# Patient Record
Sex: Female | Born: 1980 | Race: White | Hispanic: No | Marital: Married | Smoking: Never smoker
Health system: Southern US, Community
[De-identification: ages and names within clinical notes are randomized; demographics above are authoritative.]

## PROBLEM LIST (undated history)

## (undated) DIAGNOSIS — R87619 Unspecified abnormal cytological findings in specimens from cervix uteri: Secondary | ICD-10-CM

## (undated) DIAGNOSIS — E785 Hyperlipidemia, unspecified: Secondary | ICD-10-CM

## (undated) DIAGNOSIS — N946 Dysmenorrhea, unspecified: Secondary | ICD-10-CM

## (undated) DIAGNOSIS — Z90710 Acquired absence of both cervix and uterus: Secondary | ICD-10-CM

## (undated) DIAGNOSIS — Z8742 Personal history of other diseases of the female genital tract: Secondary | ICD-10-CM

## (undated) DIAGNOSIS — N879 Dysplasia of cervix uteri, unspecified: Secondary | ICD-10-CM

## (undated) DIAGNOSIS — O99345 Other mental disorders complicating the puerperium: Secondary | ICD-10-CM

## (undated) DIAGNOSIS — K219 Gastro-esophageal reflux disease without esophagitis: Secondary | ICD-10-CM

## (undated) DIAGNOSIS — E010 Iodine-deficiency related diffuse (endemic) goiter: Secondary | ICD-10-CM

## (undated) DIAGNOSIS — F53 Postpartum depression: Secondary | ICD-10-CM

## (undated) DIAGNOSIS — D069 Carcinoma in situ of cervix, unspecified: Secondary | ICD-10-CM

## (undated) HISTORY — DX: Iodine-deficiency related diffuse (endemic) goiter: E01.0

## (undated) HISTORY — DX: Dysmenorrhea, unspecified: N94.6

## (undated) HISTORY — DX: Acquired absence of both cervix and uterus: Z90.710

## (undated) HISTORY — DX: Personal history of other diseases of the female genital tract: Z87.42

## (undated) HISTORY — DX: Postpartum depression: F53.0

## (undated) HISTORY — DX: Hyperlipidemia, unspecified: E78.5

## (undated) HISTORY — DX: Unspecified abnormal cytological findings in specimens from cervix uteri: R87.619

## (undated) HISTORY — DX: Gastro-esophageal reflux disease without esophagitis: K21.9

## (undated) HISTORY — DX: Other mental disorders complicating the puerperium: O99.345

## (undated) HISTORY — DX: Carcinoma in situ of cervix, unspecified: D06.9

## (undated) HISTORY — DX: Dysplasia of cervix uteri, unspecified: N87.9

---

## 2002-07-26 DIAGNOSIS — R87619 Unspecified abnormal cytological findings in specimens from cervix uteri: Secondary | ICD-10-CM

## 2002-07-26 HISTORY — DX: Unspecified abnormal cytological findings in specimens from cervix uteri: R87.619

## 2002-09-26 DIAGNOSIS — N879 Dysplasia of cervix uteri, unspecified: Secondary | ICD-10-CM

## 2002-09-26 HISTORY — DX: Dysplasia of cervix uteri, unspecified: N87.9

## 2002-09-26 HISTORY — PX: COLPOSCOPY: SHX161

## 2004-06-27 ENCOUNTER — Inpatient Hospital Stay: Payer: Self-pay

## 2007-05-18 ENCOUNTER — Inpatient Hospital Stay: Payer: Self-pay

## 2009-12-09 ENCOUNTER — Emergency Department: Payer: Self-pay | Admitting: Emergency Medicine

## 2009-12-13 HISTORY — PX: CHOLECYSTECTOMY, LAPAROSCOPIC: SHX56

## 2014-03-15 DIAGNOSIS — E010 Iodine-deficiency related diffuse (endemic) goiter: Secondary | ICD-10-CM

## 2014-03-15 HISTORY — DX: Iodine-deficiency related diffuse (endemic) goiter: E01.0

## 2014-05-01 ENCOUNTER — Ambulatory Visit: Payer: Self-pay | Admitting: Obstetrics and Gynecology

## 2014-05-10 ENCOUNTER — Ambulatory Visit: Payer: Self-pay | Admitting: Obstetrics and Gynecology

## 2014-05-14 HISTORY — PX: SALPINGECTOMY: SHX328

## 2014-06-05 ENCOUNTER — Ambulatory Visit
Admit: 2014-06-05 | Disposition: A | Discharge status: Home / Self Care | Payer: Self-pay | Attending: Obstetrics and Gynecology | Admitting: Obstetrics and Gynecology

## 2014-06-12 ENCOUNTER — Ambulatory Visit
Admit: 2014-06-12 | Disposition: A | Discharge status: Home / Self Care | Payer: Self-pay | Attending: Obstetrics and Gynecology | Admitting: Obstetrics and Gynecology

## 2014-06-12 LAB — CBC
HCT: 45.4 % (ref 35.0–47.0)
HGB: 15.3 g/dL (ref 12.0–16.0)
MCH: 29.6 pg (ref 26.0–34.0)
MCHC: 33.7 g/dL (ref 32.0–36.0)
MCV: 88 fL (ref 80–100)
PLATELETS: 201 10*3/uL (ref 150–440)
RBC: 5.16 10*6/uL (ref 3.80–5.20)
RDW: 12.9 % (ref 11.5–14.5)
WBC: 6.8 10*3/uL (ref 3.6–11.0)

## 2014-06-13 DIAGNOSIS — Z90710 Acquired absence of both cervix and uterus: Secondary | ICD-10-CM

## 2014-06-13 HISTORY — DX: Acquired absence of both cervix and uterus: Z90.710

## 2014-06-14 ENCOUNTER — Ambulatory Visit
Admit: 2014-06-14 | Disposition: A | Discharge status: Home / Self Care | Payer: Self-pay | Attending: Obstetrics and Gynecology | Admitting: Obstetrics and Gynecology

## 2014-06-18 ENCOUNTER — Ambulatory Visit
Admit: 2014-06-18 | Disposition: A | Discharge status: Home / Self Care | Payer: Self-pay | Attending: Obstetrics and Gynecology | Admitting: Obstetrics and Gynecology

## 2014-07-08 LAB — SURGICAL PATHOLOGY

## 2014-07-14 NOTE — Op Note (Signed)
PATIENT NAME:  Candace Vaughn, Candace Vaughn MR#:  [id number] DATE OF BIRTH:  Mar 02, 1981  DATE OF PROCEDURE:  06/18/2014   ATTENDING DICTATING:  [place] Bingharlie Harleyquinn Gasser, MD   PREOPERATIVE DIAGNOSES:   1.  Adenocarcinoma in situ of the cervix.  2.  Satisfied fertility.   POSTOPERATIVE DIAGNOSES: 1.  Adenocarcinoma in situ of the cervix.  2.  Satisfied fertility.   PROCEDURE: Total laparoscopic hysterectomy, bilateral salpingectomy and cystoscopy.   SURGEON: [facility] Bingharlie Onyx Edgley. MD   ASSISTANT: Vena AustriaAndreas Staebler, MD   ANESTHESIA:  General and local.  ESTIMATED BLOOD LOSS: 100 mL.   INTRAVENOUS FLUIDS: 1100 mL crystalloid.   URINE OUTPUT: 300 mL via indwelling Foley catheter.   ANTIBIOTICS: 2 grams Ancef given preoperatively.   SPECIMENS: Cervix, uterus and bilateral tubes to pathology.   COMPLICATIONS: None.   FINDINGS: Exam under anesthesia revealed anteverted mobile uterus with no nodularity or pelvic sidewall fixations.  Also, no adnexal masses were palpable.  On diagnostic laparoscopy, a normal liver edge and stomach edge.  There was 1 filmy adhesion of the omentum to the posterior aspect of the anterior abdominal wall, which was easily lysed and there were no other abdominal or pelvic adhesions with normal-appearing fallopian tubes, ovaries, uterus and cervix. On cystoscopy, normal appearing bladder with bilateral efflux from the ureteral orifices.   DESCRIPTION OF PROCEDURE: The patient was taken to the Operating Room where the anesthesia was administered. She was then prepped and draped in normal sterile fashion in dorsal lithotomy position in [place] stirrups. Next, a Foley catheter was then placed and a speculum was inserted in the patient's vagina and a V-care uterine manipulator device was then inserted without difficulty. Next, gloves were changed and attention was then turned to the patient's umbilicus where it was infiltrated with local anesthesia and then turned to the umbilicus where it was  infiltrated with local anesthesia and using the open technique a 12 mm balloon trocar was then placed and the laparoscope was then introduced with the above-noted findings. Next, a right 5 mm and a left 10 mm port were then placed under direct visualization after injection of local anesthesia.  Next, the fallopian tubes were then separated from the ovaries after identification of the bilateral ureters into the pelvis, with the LigaSure device. Next, the bilateral round ligaments were then cauterized and transected with the LigaSure device and the bladder flap was created after separation of the anterior and posterior aspects of the broad ligament. Next, the bilateral uterine arteries were then serially cauterized and transected with the LigaSure device and a colpotomy was made with the monopolar scissors on cut and then coag current  and then the specimen was then removed from the patient's vagina and a bulb was then placed to maintain insufflation.  The vaginal cuff was then closed with a running V-lock 2-0 suture using the Endo Stitch device. There was some slight oozing near the uterosacral ligaments, fibrillar was then placed into these crevices and the pressure dropped down to 6 mmHg and the right and left aspects were then inspected for excellent hemostasis. Next, cystoscopy was then performed with the above-noted findings and attention was then turned to the patient's abdomen with showed excellent hemostasis. The right and left lower quadrant trocars were then removed under direct visualization and then the balloon trocar at the umbilicus was removed. The fascia here and at the left lower quadrant, 10 mm port, were then closed using a running suture of 2-0 Vicryl and the skin incisions there were closed  with 4-0 Monocryl with the skin incision closed a the 5 mm port with a Dermabond which was also placed at the other ports.   Sponge, lap, needle, and instrument counts were correct x 2, and the patient was  taken to recovery room, awake, alert, breathing independently in stable condition.     ____________________________ [place] Bing, MD cp:DT D: 06/18/2014 13:56:14 ET T: 06/18/2014 14:20:17 ET JOB#: [id number]  cc: [place] Bing, MD, <Dictator> [place] Bing MD ELECTRONICALLY SIGNED 06/27/2014 10:23

## 2014-07-14 NOTE — Op Note (Signed)
PATIENT NAME:  [name], Candace E MR#:  [id number] DATE OF BIRTH:  12/20/1980  DATE OF PROCEDURE:  05/10/2014  PREOPERATIVE DIAGNOSES:  1. AGUS Pap smear.  2. History of CIN-3.   POSTOPERATIVE DIAGNOSES:  1. AGUS Pap smear. 2. History of CIN-3.   PROCEDURES PERFORMED:  1. Cold knife conization. 2. Endocervical curettage.  3. Dilation and curettage.   SURGEON: [place] Bingharlie Katye Valek, M.D.   ASSISTANT: None.   ANESTHESIA:  General and paracervical block.  ANTIBIOTICS: None indicated.   ESTIMATED BLOOD LOSS: 25 mL.   URINE OUTPUT: No in and out catheterization was done of the bladder.   Venous thromboembolism prophylaxis not indicated.   COMPLICATIONS: None.   SPECIMENS: Cold knife conization with stitch at 12:00, endocervical curettage and endometrial curettings.   FINDINGS: Exam under anesthesia revealed normal anteverted uterus and no adnexal masses or uterine masses. On speculum exam and staining with Lugol's to the cervix, abnormally appearing adequate transformation zone and dysplasia noted. Uterus sounded to 8.5 cm with a minimal amount of endometrial curettings noted.   DESCRIPTION OF PROCEDURE: The patient was taken to the operating room where anesthesia was administered. She was prepped and draped in normal sterile fashion in dorsal lithotomy position in [place] stirrups. Next, a speculum was then inserted and Lugol's was stained on the cervix with the above-noted findings. Next, a paracervical block was done, as well as, parastomal block with 1% lidocaine with epinephrine. Next, two 1-0 chromics were used on the lateral portions of the cervix and tied down to help with hemostasis. Next, a #1 silk was then tied at the 12 o'clock position and another  #1 silk was placed at 6:00 and cold knife conization was done with the 11 blade and handed off to pathology with the stitch at 6:00 removed.   Next, a single-tooth tenaculum was applied to the cervix and an endocervical curettage was  done and handed off, and the uterus was sounded to 8.5 cm, and endometrial curetting was done in all quadrants of the uterus and handed off to pathology.   Hemostasis was achieved with cautery and all instruments were removed from the patient's vagina.  Sponge, lap, needle, and instrument counts were correct x2. The patient was transferred to the recovery room awake, alert, breathing independently in stable condition.    ____________________________ [place] Bingharlie Dayson Aboud, MD cp:kl D: 05/10/2014 11:37:01 ET T: 05/10/2014 23:01:29 ET JOB#: [id number]  cc: [place] Bingharlie Ad Guttman, MD, <Dictator> [place] BingHARLIE Amran Malter MD ELECTRONICALLY SIGNED 05/30/2014 17:26

## 2014-12-02 DIAGNOSIS — Z8742 Personal history of other diseases of the female genital tract: Secondary | ICD-10-CM

## 2014-12-02 HISTORY — DX: Personal history of other diseases of the female genital tract: Z87.42

## 2014-12-05 ENCOUNTER — Other Ambulatory Visit: Payer: Self-pay | Admitting: Obstetrics and Gynecology

## 2014-12-05 DIAGNOSIS — E010 Iodine-deficiency related diffuse (endemic) goiter: Secondary | ICD-10-CM

## 2014-12-12 ENCOUNTER — Ambulatory Visit
Admission: RE | Admit: 2014-12-12 | Discharge: 2014-12-12 | Disposition: A | Discharge status: Home / Self Care | Payer: Managed Care, Other (non HMO) | Source: Ambulatory Visit | Attending: Obstetrics and Gynecology | Admitting: Obstetrics and Gynecology

## 2014-12-12 DIAGNOSIS — E049 Nontoxic goiter, unspecified: Secondary | ICD-10-CM | POA: Diagnosis present

## 2014-12-12 DIAGNOSIS — E010 Iodine-deficiency related diffuse (endemic) goiter: Secondary | ICD-10-CM

## 2016-10-05 ENCOUNTER — Other Ambulatory Visit: Payer: Self-pay | Admitting: Obstetrics and Gynecology

## 2016-12-23 ENCOUNTER — Other Ambulatory Visit: Payer: Self-pay | Admitting: Obstetrics and Gynecology

## 2016-12-27 ENCOUNTER — Other Ambulatory Visit: Payer: Self-pay | Admitting: Obstetrics and Gynecology

## 2016-12-29 ENCOUNTER — Other Ambulatory Visit: Payer: Self-pay | Admitting: Obstetrics and Gynecology

## 2017-03-05 ENCOUNTER — Other Ambulatory Visit: Payer: Self-pay | Admitting: Obstetrics and Gynecology

## 2017-11-03 ENCOUNTER — Ambulatory Visit (INDEPENDENT_AMBULATORY_CARE_PROVIDER_SITE_OTHER): Payer: Self-pay | Admitting: Obstetrics and Gynecology

## 2017-11-03 ENCOUNTER — Encounter: Payer: Self-pay | Admitting: Obstetrics and Gynecology

## 2017-11-03 VITALS — BP 120/90 | HR 84 | Ht 64.0 in | Wt 205.0 lb

## 2017-11-03 DIAGNOSIS — M79671 Pain in right foot: Secondary | ICD-10-CM

## 2017-11-03 DIAGNOSIS — Z01419 Encounter for gynecological examination (general) (routine) without abnormal findings: Secondary | ICD-10-CM

## 2017-11-03 DIAGNOSIS — Z13220 Encounter for screening for lipoid disorders: Secondary | ICD-10-CM

## 2017-11-03 DIAGNOSIS — F419 Anxiety disorder, unspecified: Secondary | ICD-10-CM

## 2017-11-03 DIAGNOSIS — M25862 Other specified joint disorders, left knee: Secondary | ICD-10-CM

## 2017-11-03 DIAGNOSIS — M25842 Other specified joint disorders, left hand: Secondary | ICD-10-CM

## 2017-11-03 DIAGNOSIS — Z0000 Encounter for general adult medical examination without abnormal findings: Secondary | ICD-10-CM

## 2017-11-03 DIAGNOSIS — Z124 Encounter for screening for malignant neoplasm of cervix: Secondary | ICD-10-CM

## 2017-11-03 MED ORDER — BUSPIRONE HCL 7.5 MG PO TABS: ORAL_TABLET | ORAL | 5 refills | Status: DC

## 2017-11-03 NOTE — Progress Notes (Signed)
PCP:  Rica Records, PA-C   Chief Complaint  Patient presents with  . Gynecologic Exam     HPI:      [name] is a 37 y.o. No obstetric history on file. who LMP was No LMP recorded. Patient has had a hysterectomy., presents today for her annual examination.  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  She does not have intermenstrual bleeding.  Sex activity: single partner, contraception - post menopausal status.  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 There is no FH of ovarian cancer. The patient does do self-breast exams.  Tobacco use: The patient denies current or previous tobacco use. Alcohol use: none No drug use.  Exercise: moderately active  She does get adequate calcium but not Vitamin D in her diet.  She has a hx of hyperlipidemia, on lipitor 20 mg in the past. Stopped about a yr ago when Rx ran out. Due for labs. No side effects with Rx.  Pt also with hx of anxiety. Can't turn mind off at night. Had been taking buspar 7.5 mg QHS with improved ability to sleep. Rx ran out. Pt would like RF. Has excessive worry, feeling nervous at times. Doesn't feel she needs buspar BID. Had depression sx with zoloft in past.  FH of anxiety and pt with grief/loss in past.   Pt has also noted 3 new bumps in the past yr. She has a hard place on her RT heel that developed about a yr ago and now notices some swelling on dorsal aspect of foot. Can be painful at times. Pt then developed hard spot on patella/superior tibial aspect of LT knee about 3 months ago. Hurts if she puts pressure on it with kneeling. Finally, pt noted a knot in her LT palmar aspect around 5th MCP joint about 6 months ago. Not painful.   Past Medical History:  Diagnosis Date  . Abnormal Pap smear of cervix 09/26/2002   ASC-US  . Abnormal Pap smear of cervix 07/26/2002   LGSIL; @[facility]  SUSPICIOUS HGSIL  . Adenocarcinoma in situ (AIS) of uterine cervix   . 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 Dysmenorrhea    mild  . Esophageal reflux   . H/O: hysterectomy 06/13/2014   CP JR; TLH/BS  . History of abnormal cervical Pap smear 12/02/2014   ASCUS/neg-needs yearly paps for several yrs due to AIS  . Hyperlipidemia    started on lipitor 3/14;recheck in 5/14  . Postpartum depression   . Thyromegaly 2016   no nodules, normal thryroid labs    Past Surgical History:  Procedure Laterality Date  . CHOLECYSTECTOMY, LAPAROSCOPIC  12/2009  . COLPOSCOPY  09/26/2002   CIN2,HPV+,ECC NEG  . SALPINGECTOMY  05/2014    Family History  Problem Relation Age of Onset  . Hyperlipidemia Mother   . COPD Father   . Hypertension Father   . Hyperlipidemia Sister 43  . Dementia Maternal Grandmother        senile  . Hyperlipidemia Maternal Grandmother   . Leukemia Son        4 months    Social History   Socioeconomic History  . Marital status: Married    Spouse name: Not on file  . Number of children: Not on file  . Years of education: Not on  file  . Highest education level: Not on file  Occupational History  . Not on file  Social Needs  . Financial resource strain: Not on file  . Food insecurity:    Worry: Not on file    Inability: Not on file  . Transportation needs:    Medical: Not on file    Non-medical: Not on file  Tobacco Use  . Smoking status: Never Smoker  . Smokeless tobacco: Never Used  Substance and Sexual Activity  . Alcohol use: Yes    Comment: occasionally  . Drug use: Never  . Sexual activity: Yes    Birth control/protection: Surgical    Comment: Hysterectomy  Lifestyle  . Physical activity:    Days per week: Not on file    Minutes per session: Not on file  . Stress: Not on file  Relationships  . Social connections:    Talks on phone: Not on file    Gets together: Not on file    Attends religious service: Not on file     Active member of club or organization: Not on file    Attends meetings of clubs or organizations: Not on file    Relationship status: Not on file  . Intimate partner violence:    Fear of current or ex partner: Not on file    Emotionally abused: Not on file    Physically abused: Not on file    Forced sexual activity: Not on file  Other Topics Concern  . Not on file  Social History Narrative  . Not on file    Outpatient Medications Prior to Visit  Medication Sig Dispense Refill  . busPIRone (BUSPAR) 7.5 MG tablet TAKE 1 TABLET BY MOUTH 1-2 TIMES PER DAY (Patient not taking: Reported on 11/03/2017) 60 tablet 3   No facility-administered medications prior to visit.       ROS:  Review of Systems  Constitutional: Negative for fatigue, fever and unexpected weight change.  Respiratory: Negative for cough, shortness of breath and wheezing.   Cardiovascular: Negative for chest pain, palpitations and leg swelling.  Gastrointestinal: Negative for blood in stool, constipation, diarrhea, nausea and vomiting.  Endocrine: Negative for cold intolerance, heat intolerance and polyuria.  Genitourinary: Negative for dyspareunia, dysuria, flank pain, frequency, genital sores, hematuria, menstrual problem, pelvic pain, urgency, vaginal bleeding, vaginal discharge and vaginal pain.  Musculoskeletal: Positive for arthralgias and joint swelling. Negative for back pain and myalgias.  Skin: Negative for rash.  Neurological: Negative for dizziness, syncope, light-headedness, numbness and headaches.  Hematological: Negative for adenopathy.  Psychiatric/Behavioral: Positive for agitation. Negative for confusion, sleep disturbance and suicidal ideas. The patient is not nervous/anxious.    BREAST: No symptoms   Objective: BP 120/90   Pulse 84   Ht 5\' 4"  (1.626 m)   Wt 205 lb (93 kg)   BMI 35.19 kg/m    Physical Exam  Constitutional: She is oriented to person, place, and time. She appears  well-developed and well-nourished.  Genitourinary: Vagina normal. There is no rash or tenderness on the right labia. There is no rash or tenderness on the left labia. No erythema or tenderness in the vagina. No vaginal discharge found. Right adnexum does not display mass and does not display tenderness. Left adnexum does not display mass and does not display tenderness.  Genitourinary Comments: UTERUS/CX SURG REM  Neck: Normal range of motion. No thyromegaly present.  Cardiovascular: Normal rate, regular rhythm and normal heart sounds.  No murmur heard. Pulmonary/Chest: Effort normal  and breath sounds normal. Right breast exhibits no mass, no nipple discharge, no skin change and no tenderness. Left breast exhibits no mass, no nipple discharge, no skin change and no tenderness.  Abdominal: Soft. There is no tenderness. There is no guarding.  Musculoskeletal: Normal range of motion.       Left knee: No tenderness found.       Arms:      Legs:      Feet:  Neurological: She is alert and oriented to person, place, and time. No cranial nerve deficit.  Psychiatric: She has a normal mood and affect. Her behavior is normal.  Vitals reviewed.   Assessment/Plan: Encounter for annual routine gynecological examination  Cervical cancer screening - MDL pap since self pay.  - Plan: Other/Misc lab test  Blood tests for routine general physical examination - Hx of fatty liver in past. Check CMP. - Plan: Comprehensive metabolic panel, Lipid panel, CANCELED: Lipid panel  Screening cholesterol level - Will call with results. Will resume lipitor prn. - Plan: Lipid panel, CANCELED: Lipid panel  Anxiety - Rx RF buspar QD to BID. - Plan: busPIRone (BUSPAR) 7.5 MG tablet  Cyst of knee joint, left - Follow up if sx change/worsen.  Cyst of joint of left hand - Poss ganglion cyst. NT. F/u prn.   Pain of right heel - Bony prominenct RT heel. Question etiology. F/u with PCP prn.  Meds ordered this encounter    Medications  . busPIRone (BUSPAR) 7.5 MG tablet    Sig: TAKE 1 TABLET BY MOUTH 1-2 TIMES PER DAY    Dispense:  60 tablet    Refill:  5    Order Specific Question:   Supervising Provider    Answer:   Nadara MustardHARRIS, ROBERT P [[id number]]             GYN counsel breast self exam, adequate intake of calcium and vitamin D, diet and exercise     F/U  Return in about 1 year (around 11/04/2018).  Jakevious Hollister B. Ebony Rickel, PA-C 11/03/2017 9:25 AM

## 2017-11-03 NOTE — Patient Instructions (Signed)
I value your feedback and entrusting us with your care. If you get a Candace Vaughn patient survey, I would appreciate you taking the time to let us know about your experience today. Thank you! 

## 2017-11-04 LAB — COMPREHENSIVE METABOLIC PANEL
A/G RATIO: 1.8 (ref 1.2–2.2)
ALT: 60 IU/L — AB (ref 0–32)
AST: 30 IU/L (ref 0–40)
Albumin: 4.5 g/dL (ref 3.5–5.5)
Alkaline Phosphatase: 100 IU/L (ref 39–117)
BUN/Creatinine Ratio: 9 (ref 9–23)
BUN: 8 mg/dL (ref 6–20)
Bilirubin Total: 0.4 mg/dL (ref 0.0–1.2)
CO2: 17 mmol/L — ABNORMAL LOW (ref 20–29)
Calcium: 9.4 mg/dL (ref 8.7–10.2)
Chloride: 103 mmol/L (ref 96–106)
Creatinine, Ser: 0.86 mg/dL (ref 0.57–1.00)
GFR calc non Af Amer: 88 mL/min/{1.73_m2} (ref >59)
GFR, EST AFRICAN AMERICAN: 102 mL/min/{1.73_m2} (ref >59)
Globulin, Total: 2.5 g/dL (ref 1.5–4.5)
Glucose: 89 mg/dL (ref 65–99)
Potassium: 4.5 mmol/L (ref 3.5–5.2)
Sodium: 137 mmol/L (ref 134–144)
Total Protein: 7 g/dL (ref 6.0–8.5)

## 2017-11-04 LAB — LIPID PANEL
CHOL/HDL RATIO: 5 ratio — AB (ref 0.0–4.4)
Cholesterol, Total: 239 mg/dL — ABNORMAL HIGH (ref 100–199)
HDL: 48 mg/dL (ref >39)
LDL Calculated: 161 mg/dL — ABNORMAL HIGH (ref 0–99)
Triglycerides: 151 mg/dL — ABNORMAL HIGH (ref 0–149)
VLDL Cholesterol Cal: 30 mg/dL (ref 5–40)

## 2017-11-07 ENCOUNTER — Telehealth: Payer: Self-pay | Admitting: Obstetrics and Gynecology

## 2017-11-07 DIAGNOSIS — R7989 Other specified abnormal findings of blood chemistry: Secondary | ICD-10-CM

## 2017-11-07 DIAGNOSIS — E782 Mixed hyperlipidemia: Secondary | ICD-10-CM

## 2017-11-07 DIAGNOSIS — R945 Abnormal results of liver function studies: Secondary | ICD-10-CM

## 2017-11-07 MED ORDER — ATORVASTATIN CALCIUM 20 MG PO TABS
20.0000 mg | ORAL_TABLET | Freq: Every day | ORAL | 1 refills | Status: DC
Start: 2017-11-07 — End: 2018-01-11

## 2017-11-07 NOTE — Telephone Encounter (Signed)
Pt aware of abn lipids and need to restart lipitor 20 mg. Rx eRxd. REchk in 2 months. Stopped meds about a yr ago.  Pt aware of increased ALT. Hx of abn LFTs in 2017, but improved this yr. Question fatty liver. Diet/exercise/wt loss. Rechk in 2 months.

## 2017-11-15 ENCOUNTER — Encounter: Payer: Self-pay | Admitting: Obstetrics and Gynecology

## 2018-01-11 ENCOUNTER — Other Ambulatory Visit: Payer: Self-pay | Admitting: Obstetrics and Gynecology

## 2018-01-11 ENCOUNTER — Other Ambulatory Visit: Payer: Self-pay

## 2018-01-11 DIAGNOSIS — E782 Mixed hyperlipidemia: Secondary | ICD-10-CM

## 2018-01-11 DIAGNOSIS — R945 Abnormal results of liver function studies: Secondary | ICD-10-CM

## 2018-01-11 DIAGNOSIS — R7989 Other specified abnormal findings of blood chemistry: Secondary | ICD-10-CM

## 2018-01-12 ENCOUNTER — Other Ambulatory Visit: Payer: Self-pay | Admitting: Obstetrics and Gynecology

## 2018-01-12 ENCOUNTER — Encounter: Payer: Self-pay | Admitting: Obstetrics and Gynecology

## 2018-01-12 LAB — COMPREHENSIVE METABOLIC PANEL
ALT: 51 IU/L — ABNORMAL HIGH (ref 0–32)
AST: 26 IU/L (ref 0–40)
Albumin/Globulin Ratio: 2.5 — ABNORMAL HIGH (ref 1.2–2.2)
Albumin: 5 g/dL (ref 3.5–5.5)
Alkaline Phosphatase: 107 IU/L (ref 39–117)
BUN/Creatinine Ratio: 12 (ref 9–23)
BUN: 10 mg/dL (ref 6–20)
Bilirubin Total: 0.6 mg/dL (ref 0.0–1.2)
CALCIUM: 10 mg/dL (ref 8.7–10.2)
CO2: 22 mmol/L (ref 20–29)
Chloride: 102 mmol/L (ref 96–106)
Creatinine, Ser: 0.83 mg/dL (ref 0.57–1.00)
GFR, EST AFRICAN AMERICAN: 106 mL/min/{1.73_m2} (ref >59)
GFR, EST NON AFRICAN AMERICAN: 92 mL/min/{1.73_m2} (ref >59)
Globulin, Total: 2 g/dL (ref 1.5–4.5)
Glucose: 83 mg/dL (ref 65–99)
Potassium: 4.7 mmol/L (ref 3.5–5.2)
Sodium: 139 mmol/L (ref 134–144)
TOTAL PROTEIN: 7 g/dL (ref 6.0–8.5)

## 2018-01-12 LAB — LIPID PANEL
Chol/HDL Ratio: 3.2 ratio (ref 0.0–4.4)
Cholesterol, Total: 159 mg/dL (ref 100–199)
HDL: 50 mg/dL (ref >39)
LDL Calculated: 83 mg/dL (ref 0–99)
TRIGLYCERIDES: 129 mg/dL (ref 0–149)
VLDL CHOLESTEROL CAL: 26 mg/dL (ref 5–40)

## 2018-01-12 NOTE — Telephone Encounter (Signed)
Pt left msg on nurse line that MyChart isn't working.  She has received a text about lab results. She thinks is her cholesterol results.  She is out of her cholesterol medicine and needs a refill sent to [facility] [phone]

## 2018-01-12 NOTE — Telephone Encounter (Signed)
Please advise 

## 2018-01-12 NOTE — Progress Notes (Signed)
Pt aware. PT asked to remove [facility] from preferred pharmacy list.

## 2018-01-12 NOTE — Progress Notes (Signed)
Pls let pt know lipids improved with lipitor. Cont 20 mg dose. Rx RF eRxd; recheck at 8/20 annual. ALT improved, need diet/exericse/wt loss. Rechk 8/20.

## 2018-02-23 ENCOUNTER — Encounter: Payer: Self-pay | Admitting: Obstetrics and Gynecology

## 2018-02-27 NOTE — Telephone Encounter (Signed)
Pls help

## 2018-06-17 ENCOUNTER — Other Ambulatory Visit: Payer: Self-pay | Admitting: Obstetrics and Gynecology

## 2018-06-17 DIAGNOSIS — F419 Anxiety disorder, unspecified: Secondary | ICD-10-CM

## 2018-06-19 ENCOUNTER — Encounter: Payer: Self-pay | Admitting: Obstetrics and Gynecology

## 2018-07-18 ENCOUNTER — Other Ambulatory Visit: Payer: Self-pay | Admitting: Obstetrics and Gynecology

## 2018-07-18 DIAGNOSIS — F419 Anxiety disorder, unspecified: Secondary | ICD-10-CM

## 2018-07-19 NOTE — Telephone Encounter (Signed)
Please advise 

## 2018-08-20 ENCOUNTER — Other Ambulatory Visit: Payer: Self-pay | Admitting: Obstetrics and Gynecology

## 2018-08-20 DIAGNOSIS — F419 Anxiety disorder, unspecified: Secondary | ICD-10-CM

## 2018-08-21 NOTE — Telephone Encounter (Signed)
Please advise 

## 2018-09-20 ENCOUNTER — Other Ambulatory Visit: Payer: Self-pay | Admitting: Obstetrics and Gynecology

## 2018-09-20 DIAGNOSIS — F419 Anxiety disorder, unspecified: Secondary | ICD-10-CM

## 2018-09-20 NOTE — Telephone Encounter (Signed)
Please advise 

## 2018-10-16 ENCOUNTER — Other Ambulatory Visit: Payer: Self-pay | Admitting: Obstetrics and Gynecology

## 2018-10-16 DIAGNOSIS — E782 Mixed hyperlipidemia: Secondary | ICD-10-CM

## 2018-10-16 NOTE — Telephone Encounter (Signed)
Please advise 

## 2018-10-23 ENCOUNTER — Other Ambulatory Visit: Payer: Self-pay | Admitting: Obstetrics and Gynecology

## 2018-10-23 DIAGNOSIS — F419 Anxiety disorder, unspecified: Secondary | ICD-10-CM

## 2018-11-05 NOTE — Progress Notes (Addendum)
PCP:  Chad Cordial, PA-C   Chief Complaint  Patient presents with  . Gynecologic Exam     HPI:      [name] is a 38 y.o. No obstetric history on file. who LMP was No LMP recorded. Patient has had a hysterectomy., presents today for her annual examination.  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  She does not have intermenstrual bleeding.  Sex activity: single partner, contraception - post menopausal status.  Last Pap:11/03/17 Results were: ASCUS with neg HPV DNA. Repeat pap due today. Hx of STDs: HPV. Hx of cx AIS 4/16 with subsequent TAH/BS  There is a new FH of breast cancer in her mom and mat grt aunt this yr, genetic testing not indicated for pt. There is no FH of ovarian cancer. The patient does do self-breast exams.  Tobacco use: The patient denies current or previous tobacco use. Alcohol use: none No drug use.  Exercise: moderately active  She does get adequate calcium but not Vitamin D in her diet.  She has a hx of hyperlipidemia, on lipitor 20 mg without side effects. Labs due today.   Pt also with hx of anxiety. Can't turn mind off at night. Buspar 7.5 mg QHS improves sleep. Has excessive worry, feeling nervous at times. Takes buspar BID with sx control. Had depression sx with zoloft in past.  FH of anxiety and pt with grief/loss in past.   Pt is self pay  Past Medical History:  Diagnosis Date  . Abnormal Pap smear of cervix 09/26/2002   ASC-US  . Abnormal Pap smear of cervix 07/26/2002   LGSIL; @[facility]  SUSPICIOUS HGSIL  . Adenocarcinoma in situ (AIS) of uterine cervix   . 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 Dysmenorrhea    mild  . Esophageal reflux   . H/O: hysterectomy 06/13/2014   CP JR; TLH/BS  . History of abnormal cervical Pap smear 12/02/2014   ASCUS/neg-needs yearly paps for several yrs due to AIS  . Hyperlipidemia    started on lipitor 3/14;recheck in 5/14  . Postpartum depression   . Thyromegaly 2016    no nodules, normal thryroid labs    Past Surgical History:  Procedure Laterality Date  . CHOLECYSTECTOMY, LAPAROSCOPIC  12/2009  . COLPOSCOPY  09/26/2002   CIN2,HPV+,ECC NEG  . SALPINGECTOMY  05/2014    Family History  Problem Relation Age of Onset  . Hyperlipidemia Mother   . Breast cancer Mother 80  . COPD Father   . Hypertension Father   . Hyperlipidemia Sister 73  . Dementia Maternal Grandmother        senile  . Hyperlipidemia Maternal Grandmother   . Leukemia Son        4 months  . Breast cancer Maternal Aunt 70       has contact with her    Social History   Socioeconomic History  . Marital status: Married    Spouse name: Not on file  . Number of children: Not on file  . Years of education: Not on file  . Highest education level: Not on file  Occupational History  . Not on file  Social Needs  . Financial resource strain: Not on file  . Food insecurity    Worry: Not on file    Inability: Not on file  . Transportation needs    Medical: Not on file    Non-medical: Not on file  Tobacco Use  .  Smoking status: Never Smoker  . Smokeless tobacco: Never Used  Substance and Sexual Activity  . Alcohol use: Yes    Comment: occasionally  . Drug use: Never  . Sexual activity: Yes    Birth control/protection: Surgical    Comment: Hysterectomy  Lifestyle  . Physical activity    Days per week: Not on file    Minutes per session: Not on file  . Stress: Not on file  Relationships  . Social Musicianconnections    Talks on phone: Not on file    Gets together: Not on file    Attends religious service: Not on file    Active member of club or organization: Not on file    Attends meetings of clubs or organizations: Not on file    Relationship status: Not on file  . Intimate partner violence    Fear of current or ex partner: Not on file    Emotionally abused: Not on file    Physically abused: Not on file    Forced sexual activity: Not on file  Other Topics Concern  . Not  on file  Social History Narrative  . Not on file    Outpatient Medications Prior to Visit  Medication Sig Dispense Refill  . atorvastatin (LIPITOR) 20 MG tablet Take 1 tablet by mouth once daily 90 tablet 0  . busPIRone (BUSPAR) 7.5 MG tablet Take 1 tablet by mouth twice daily 60 tablet 0   No facility-administered medications prior to visit.       ROS:  Review of Systems  Constitutional: Negative for fatigue, fever and unexpected weight change.  Respiratory: Negative for cough, shortness of breath and wheezing.   Cardiovascular: Negative for chest pain, palpitations and leg swelling.  Gastrointestinal: Negative for blood in stool, constipation, diarrhea, nausea and vomiting.  Endocrine: Negative for cold intolerance, heat intolerance and polyuria.  Genitourinary: Negative for dyspareunia, dysuria, flank pain, frequency, genital sores, hematuria, menstrual problem, pelvic pain, urgency, vaginal bleeding, vaginal discharge and vaginal pain.  Musculoskeletal: Positive for arthralgias. Negative for back pain, joint swelling and myalgias.  Skin: Negative for rash.  Neurological: Negative for dizziness, syncope, light-headedness, numbness and headaches.  Hematological: Negative for adenopathy.  Psychiatric/Behavioral: Negative for agitation, confusion, sleep disturbance and suicidal ideas. The patient is not nervous/anxious.   BREAST: No symptoms   Objective: BP 104/70   Ht 5\' 3"  (1.6 m)   Wt 202 lb 9.6 oz (91.9 kg)   BMI 35.89 kg/m    Physical Exam Constitutional:      Appearance: She is well-developed.  Genitourinary:     Vulva, vagina, right adnexa and left adnexa normal.     No vaginal discharge, erythema or tenderness.     Cervix is absent.     No right or left adnexal mass present.     Right adnexa not tender.     Left adnexa not tender.     Genitourinary Comments: UTERUS/CX SURG REM  Neck:     Musculoskeletal: Normal range of motion.     Thyroid: No thyromegaly.   Cardiovascular:     Rate and Rhythm: Normal rate and regular rhythm.     Heart sounds: Normal heart sounds. No murmur.  Pulmonary:     Effort: Pulmonary effort is normal.     Breath sounds: Normal breath sounds.  Chest:     Breasts:        Right: No mass, nipple discharge, skin change or tenderness.  Left: No mass, nipple discharge, skin change or tenderness.  Abdominal:     Palpations: Abdomen is soft.     Tenderness: There is no abdominal tenderness. There is no guarding.  Musculoskeletal: Normal range of motion.  Neurological:     General: No focal deficit present.     Mental Status: She is alert and oriented to person, place, and time.     Cranial Nerves: No cranial nerve deficit.  Skin:    General: Skin is warm and dry.  Psychiatric:        Mood and Affect: Mood normal.        Behavior: Behavior normal.        Thought Content: Thought content normal.        Judgment: Judgment normal.  Vitals signs reviewed.     Assessment/Plan: Encounter for annual routine gynecological examination  Cervical cancer screening - Plan: Other/Misc lab test  Screening for HPV (human papillomavirus) - Plan: Other/Misc lab test; MDL pap due to self pay. Will call with results.   Adenocarcinoma in situ (AIS) of uterine cervix - Plan: Other/Misc lab test  Blood tests for routine general physical examination - Plan: Lipid panel, Comprehensive metabolic panel  Mixed hyperlipidemia - Plan: Lipid panel; Will call with results and RF lipitor based on levels.  Abnormal LFTs - Plan: Comprehensive metabolic panel; rechk today. Will call with results  Anxiety - Rx RF buspar BID. - Plan: busPIRone (BUSPAR) 7.5 MG tablet             GYN counsel breast self exam, adequate intake of calcium and vitamin D, diet and exercise     F/U  Return in about 1 year (around 11/06/2019).  Alicia B. Copland, PA-C 11/06/2018 10:08 AM

## 2018-11-06 ENCOUNTER — Other Ambulatory Visit: Payer: Self-pay

## 2018-11-06 ENCOUNTER — Encounter: Payer: Self-pay | Admitting: Obstetrics & Gynecology

## 2018-11-06 ENCOUNTER — Encounter: Payer: Self-pay | Admitting: Obstetrics and Gynecology

## 2018-11-06 ENCOUNTER — Ambulatory Visit (INDEPENDENT_AMBULATORY_CARE_PROVIDER_SITE_OTHER): Payer: Self-pay | Admitting: Obstetrics and Gynecology

## 2018-11-06 VITALS — BP 104/70 | Ht 63.0 in | Wt 202.6 lb

## 2018-11-06 DIAGNOSIS — E782 Mixed hyperlipidemia: Secondary | ICD-10-CM

## 2018-11-06 DIAGNOSIS — R945 Abnormal results of liver function studies: Secondary | ICD-10-CM

## 2018-11-06 DIAGNOSIS — R7989 Other specified abnormal findings of blood chemistry: Secondary | ICD-10-CM | POA: Insufficient documentation

## 2018-11-06 DIAGNOSIS — F419 Anxiety disorder, unspecified: Secondary | ICD-10-CM

## 2018-11-06 DIAGNOSIS — Z124 Encounter for screening for malignant neoplasm of cervix: Secondary | ICD-10-CM

## 2018-11-06 DIAGNOSIS — Z0000 Encounter for general adult medical examination without abnormal findings: Secondary | ICD-10-CM

## 2018-11-06 DIAGNOSIS — D069 Carcinoma in situ of cervix, unspecified: Secondary | ICD-10-CM | POA: Insufficient documentation

## 2018-11-06 DIAGNOSIS — Z01419 Encounter for gynecological examination (general) (routine) without abnormal findings: Secondary | ICD-10-CM

## 2018-11-06 DIAGNOSIS — Z1151 Encounter for screening for human papillomavirus (HPV): Secondary | ICD-10-CM

## 2018-11-06 MED ORDER — BUSPIRONE HCL 7.5 MG PO TABS: 7.5000 mg | ORAL_TABLET | Freq: Every day | ORAL | 3 refills | Status: DC

## 2018-11-06 NOTE — Patient Instructions (Signed)
I value your feedback and entrusting us with your care. If you get a [facility] patient survey, I would appreciate you taking the time to let us know about your experience today. Thank you! 

## 2018-11-07 LAB — LIPID PANEL
Chol/HDL Ratio: 3.1 ratio (ref 0.0–4.4)
Cholesterol, Total: 148 mg/dL (ref 100–199)
HDL: 48 mg/dL (ref >39)
LDL Calculated: 79 mg/dL (ref 0–99)
Triglycerides: 106 mg/dL (ref 0–149)
VLDL Cholesterol Cal: 21 mg/dL (ref 5–40)

## 2018-11-07 LAB — COMPREHENSIVE METABOLIC PANEL
ALT: 41 IU/L — ABNORMAL HIGH (ref 0–32)
AST: 23 IU/L (ref 0–40)
Albumin/Globulin Ratio: 2.3 — ABNORMAL HIGH (ref 1.2–2.2)
Albumin: 4.8 g/dL (ref 3.8–4.8)
Alkaline Phosphatase: 114 IU/L (ref 39–117)
BUN/Creatinine Ratio: 11 (ref 9–23)
BUN: 9 mg/dL (ref 6–20)
Bilirubin Total: 0.4 mg/dL (ref 0.0–1.2)
CO2: 21 mmol/L (ref 20–29)
Calcium: 9.8 mg/dL (ref 8.7–10.2)
Chloride: 104 mmol/L (ref 96–106)
Creatinine, Ser: 0.83 mg/dL (ref 0.57–1.00)
GFR calc Af Amer: 106 mL/min/{1.73_m2} (ref >59)
GFR calc non Af Amer: 92 mL/min/{1.73_m2} (ref >59)
Globulin, Total: 2.1 g/dL (ref 1.5–4.5)
Glucose: 87 mg/dL (ref 65–99)
Potassium: 4.5 mmol/L (ref 3.5–5.2)
Sodium: 139 mmol/L (ref 134–144)
Total Protein: 6.9 g/dL (ref 6.0–8.5)

## 2018-11-08 ENCOUNTER — Other Ambulatory Visit: Payer: Self-pay | Admitting: Obstetrics and Gynecology

## 2018-11-08 DIAGNOSIS — E782 Mixed hyperlipidemia: Secondary | ICD-10-CM

## 2018-11-08 MED ORDER — ATORVASTATIN CALCIUM 20 MG PO TABS: 20.0000 mg | ORAL_TABLET | Freq: Every day | ORAL | 3 refills | Status: DC

## 2018-11-08 NOTE — Progress Notes (Signed)
Rx RF lipitor. Labs good, rechk 1 yr.

## 2018-11-12 ENCOUNTER — Encounter: Payer: Self-pay | Admitting: Obstetrics and Gynecology

## 2018-11-13 ENCOUNTER — Other Ambulatory Visit: Payer: Self-pay | Admitting: Obstetrics and Gynecology

## 2018-11-13 DIAGNOSIS — F419 Anxiety disorder, unspecified: Secondary | ICD-10-CM

## 2018-11-13 MED ORDER — BUSPIRONE HCL 7.5 MG PO TABS: 7.5000 mg | ORAL_TABLET | Freq: Two times a day (BID) | ORAL | 3 refills | Status: DC

## 2018-11-15 ENCOUNTER — Other Ambulatory Visit: Payer: Self-pay | Admitting: Obstetrics & Gynecology

## 2018-11-15 NOTE — Progress Notes (Signed)
PAP (MDL ) normal Barnett Applebaum, MD, [facility], [facility] 11/15/2018  11:39 AM

## 2018-12-05 ENCOUNTER — Encounter: Payer: Self-pay | Admitting: Obstetrics and Gynecology

## 2018-12-06 ENCOUNTER — Encounter: Payer: Self-pay | Admitting: Obstetrics and Gynecology

## 2018-12-06 ENCOUNTER — Other Ambulatory Visit: Payer: Self-pay | Admitting: Obstetrics and Gynecology

## 2018-12-06 DIAGNOSIS — Z1239 Encounter for other screening for malignant neoplasm of breast: Secondary | ICD-10-CM

## 2018-12-11 ENCOUNTER — Other Ambulatory Visit: Payer: Self-pay

## 2018-12-12 ENCOUNTER — Ambulatory Visit: Payer: Self-pay | Attending: Oncology | Admitting: *Deleted

## 2018-12-12 ENCOUNTER — Ambulatory Visit
Admission: RE | Admit: 2018-12-12 | Discharge: 2018-12-12 | Disposition: A | Discharge status: Home / Self Care | Payer: Self-pay | Source: Ambulatory Visit | Attending: Oncology | Admitting: Oncology

## 2018-12-12 ENCOUNTER — Other Ambulatory Visit: Payer: Self-pay

## 2018-12-12 DIAGNOSIS — Z0000 Encounter for general adult medical examination without abnormal findings: Secondary | ICD-10-CM

## 2018-12-12 NOTE — Progress Notes (Signed)
  Subjective:     Patient ID: Candace Vaughn, female   DOB: 09-14-1980, 38 y.o.   MRN: [id number]  HPI   Review of Systems     Objective:   Physical Exam Chest:     Breasts: Breasts are asymmetrical.        Right: No swelling, bleeding, inverted nipple, mass, nipple discharge, skin change or tenderness.        Left: No swelling, bleeding, inverted nipple, mass, nipple discharge, skin change or tenderness.       Comments: Left breast larger than the right Lymphadenopathy:     Upper Body:     Right upper body: No supraclavicular or axillary adenopathy.     Left upper body: No supraclavicular or axillary adenopathy.        Assessment:     38 year old White female referred to [facility] by Fraser Din for clinical breast exam and mammogram.  Patient with a family history of breast cancer in her mom at age 105, and a maternal great aunt with breast cancer age 38.  Patient's son also had leukemia and died prior to elementary school.  Patient has a Tyrer-Cuzick risk score of 18.8 % lifetime risk of breast cancer.  Since she is just under the 20% risk that would put her at high risk for breast cancer, I think it's prudent to have her get a baseline mammogram through our [facility].  Clinical breast exam is unremarkable.  Taught self breast awareness.       Plan:     Screening mammogram ordered.  Will follow up per protocol.

## 2018-12-12 NOTE — Progress Notes (Unsigned)
Risk Assessment    Risk Scores      12/11/2018   Last edited by: Orson Slick, CMA   5-year risk: 0.7 %   Lifetime risk: 19.8 %

## 2018-12-13 ENCOUNTER — Encounter: Payer: Self-pay | Admitting: *Deleted

## 2018-12-13 NOTE — Patient Instructions (Signed)
Gave patient hand-out, Women Staying Healthy, Active and Well from [facility], with education on breast health, pap smears, heart and colon health. 

## 2019-01-01 ENCOUNTER — Ambulatory Visit
Admission: RE | Admit: 2019-01-01 | Discharge: 2019-01-01 | Disposition: A | Discharge status: Home / Self Care | Payer: Self-pay | Source: Ambulatory Visit | Attending: Oncology | Admitting: Oncology

## 2019-01-01 DIAGNOSIS — Z0000 Encounter for general adult medical examination without abnormal findings: Secondary | ICD-10-CM

## 2019-01-09 ENCOUNTER — Encounter: Payer: Self-pay | Admitting: Obstetrics and Gynecology

## 2019-01-12 ENCOUNTER — Encounter: Payer: Self-pay | Admitting: *Deleted

## 2019-01-12 NOTE — Progress Notes (Signed)
Letter mailed from the [facility] to inform patient of her normal mammogram results.  Patient is to follow-up with annual screening in one year.  HSIS to Christy. 

## 2019-02-19 ENCOUNTER — Encounter: Payer: Self-pay | Admitting: *Deleted

## 2019-11-13 ENCOUNTER — Ambulatory Visit: Payer: Self-pay | Admitting: Obstetrics and Gynecology

## 2019-11-20 NOTE — Patient Instructions (Signed)
I value your feedback and entrusting us with your care. If you get a [facility] patient survey, I would appreciate you taking the time to let us know about your experience today. Thank you!  As of February 22, 2019, your lab results will be released to your MyChart immediately, before I even have a chance to see them. Please give me time to review them and contact you if there are any abnormalities. Thank you for your patience.  

## 2019-11-20 NOTE — Progress Notes (Addendum)
PCP:  Rica Records, PA-C   Chief Complaint  Patient presents with  . Gynecologic Exam     HPI:      Candace Vaughn is a 39 y.o. No obstetric history on file. who LMP was No LMP recorded. Patient has had a hysterectomy., presents today for her annual examination.  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  She does not have intermenstrual bleeding.  Sex activity: single partner, contraception - post menopausal status.  Last Pap: 11/06/18 Results were: neg cells, neg HPV DNA.  2019 With ASCUS with neg HPV DNA. Repeat pap due today. If HPV negative today, then can recheck Q3 yrs for 25 yrs per [facility]. 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 There is no FH of ovarian cancer. The patient does do self-breast exams.  Tobacco use: The patient denies current or previous tobacco use. Alcohol use: none No drug use.  Exercise: moderately active  She does get adequate calcium and Vitamin D in her diet.  She has a hx of hyperlipidemia, on lipitor 20 mg without side effects. Labs due today. Lipids low 8/20, may be able to decrease to 10 mg dose this yr after lab recheck.  Pt also with hx of anxiety. Has excessive worry, feeling nervous at times, sleep disturbance. Takes buspar BID with sx control. Had depression sx with zoloft in past.  FH of anxiety and pt with grief/loss in past.   Pt noticed small amt BRBPR after BM 2 wks ago (not hard, large, no straining) and then noticed mass at anus this wk. Not tender, no rectal bleeding. Hx of hemorrhoids in the past. No FH colon/rectal cancer.  HAS FP MCD  Past Medical History:  Diagnosis Date  . Abnormal Pap smear of cervix 09/26/2002   ASC-US  . Abnormal Pap smear of cervix 07/26/2002   LGSIL; @[facility]  SUSPICIOUS HGSIL  . Adenocarcinoma in situ (AIS) of uterine cervix   . 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 Dysmenorrhea    mild  . Esophageal reflux   . H/O: hysterectomy 06/13/2014   CP JR; TLH/BS  . History of abnormal cervical Pap smear 12/02/2014   ASCUS/neg-needs yearly paps for several yrs due to AIS  . Hyperlipidemia    started on lipitor 3/14;recheck in 5/14  . Postpartum depression   . Thyromegaly 2016   no nodules, normal thryroid labs    Past Surgical History:  Procedure Laterality Date  . CHOLECYSTECTOMY, LAPAROSCOPIC  12/2009  . COLPOSCOPY  09/26/2002   CIN2,HPV+,ECC NEG  . SALPINGECTOMY  05/2014    Family History  Problem Relation Age of Onset  . Hyperlipidemia Mother   . Breast cancer Mother 12  . Osteopenia Mother   . COPD Father   . Hypertension Father   . Hyperlipidemia Sister 30  . Dementia Maternal Grandmother        senile  . Hyperlipidemia Maternal Grandmother   . Leukemia Son        4 months  . Breast cancer Maternal Aunt 67       has contact with her    Social History   Socioeconomic History  . Marital status: Married    Spouse name: Not on file  . Number of children: Not on file  . Years  of education: Not on file  . Highest education level: Not on file  Occupational History  . Not on file  Tobacco Use  . Smoking status: Never Smoker  . Smokeless tobacco: Never Used  Vaping Use  . Vaping Use: Never used  Substance and Sexual Activity  . Alcohol use: Yes    Comment: occasionally  . Drug use: Never  . Sexual activity: Yes    Birth control/protection: Surgical    Comment: Hysterectomy  Other Topics Concern  . Not on file  Social History Narrative  . Not on file   Social Determinants of Health   Financial Resource Strain:   . Difficulty of Paying Living Expenses: Not on file  Food Insecurity:   . Worried About Programme researcher, broadcasting/film/video in the Last Year: Not on file  . Ran Out of Food in the Last Year: Not on file  Transportation Needs:   . Lack of Transportation (Medical): Not on file  . Lack of Transportation  (Non-Medical): Not on file  Physical Activity:   . Days of Exercise per Week: Not on file  . Minutes of Exercise per Session: Not on file  Stress:   . Feeling of Stress : Not on file  Social Connections:   . Frequency of Communication with Friends and Family: Not on file  . Frequency of Social Gatherings with Friends and Family: Not on file  . Attends Religious Services: Not on file  . Active Member of Clubs or Organizations: Not on file  . Attends Banker Meetings: Not on file  . Marital Status: Not on file  Intimate Partner Violence:   . Fear of Current or Ex-Partner: Not on file  . Emotionally Abused: Not on file  . Physically Abused: Not on file  . Sexually Abused: Not on file    Outpatient Medications Prior to Visit  Medication Sig Dispense Refill  . atorvastatin (LIPITOR) 20 MG tablet Take 1 tablet (20 mg total) by mouth daily. 90 tablet 3  . busPIRone (BUSPAR) 7.5 MG tablet Take 1 tablet (7.5 mg total) by mouth 2 (two) times daily. 180 tablet 3   No facility-administered medications prior to visit.      ROS:  Review of Systems  Constitutional: Negative for fatigue, fever and unexpected weight change.  Respiratory: Negative for cough, shortness of breath and wheezing.   Cardiovascular: Negative for chest pain, palpitations and leg swelling.  Gastrointestinal: Positive for anal bleeding. Negative for blood in stool, constipation, diarrhea, nausea and vomiting.  Endocrine: Negative for cold intolerance, heat intolerance and polyuria.  Genitourinary: Negative for dyspareunia, dysuria, flank pain, frequency, genital sores, hematuria, menstrual problem, pelvic pain, urgency, vaginal bleeding, vaginal discharge and vaginal pain.  Musculoskeletal: Negative for arthralgias, back pain, joint swelling and myalgias.  Skin: Negative for rash.  Neurological: Negative for dizziness, syncope, light-headedness, numbness and headaches.  Hematological: Negative for  adenopathy.  Psychiatric/Behavioral: Positive for agitation. Negative for confusion, sleep disturbance and suicidal ideas. The patient is not nervous/anxious.   BREAST: tenderness   Objective: BP 120/70   Ht 5\' 4"  (1.626 m)   Wt 201 lb (91.2 kg)   BMI 34.50 kg/m    Physical Exam Constitutional:      Appearance: She is well-developed.  Genitourinary:     Vulva, vagina, cervix, uterus, right adnexa and left adnexa normal.     No vulval lesion or tenderness noted.     No vaginal discharge, erythema or tenderness.     Cervix  is absent.     No cervical polyp.     Uterus is not enlarged or tender.     No right or left adnexal mass present.     Right adnexa not tender.     Left adnexa not tender.     Genitourinary Comments: UTERUS/CX SURG REM; 1 CM NT MASS LT PERIANAL AREA ABOUT 4-5:00; NO SKIN CHANGES  Rectum:     Guaiac result negative.     Rectal mass present.  Neck:     Thyroid: No thyromegaly.  Cardiovascular:     Rate and Rhythm: Normal rate and regular rhythm.     Heart sounds: Normal heart sounds. No murmur heard.   Pulmonary:     Effort: Pulmonary effort is normal.     Breath sounds: Normal breath sounds.  Chest:     Breasts:        Right: No mass, nipple discharge, skin change or tenderness.        Left: No mass, nipple discharge, skin change or tenderness.    Abdominal:     Palpations: Abdomen is soft.     Tenderness: There is no abdominal tenderness. There is no guarding.  Musculoskeletal:        General: Normal range of motion.     Cervical back: Normal range of motion.  Neurological:     General: No focal deficit present.     Mental Status: She is alert and oriented to person, place, and time.     Cranial Nerves: No cranial nerve deficit.  Skin:    General: Skin is warm and dry.  Psychiatric:        Mood and Affect: Mood normal.        Behavior: Behavior normal.        Thought Content: Thought content normal.        Judgment: Judgment normal.    Vitals reviewed.    Results for orders placed or performed in visit on 11/21/19 (from the past 24 hour(s))  POCT Occult Blood Stool     Status: Normal   Collection Time: 11/21/19  9:25 AM  Result Value Ref Range   Fecal Occult Blood, POC Negative Negative   Card #1 Date     Card #2 Fecal Occult Blod, POC     Card #2 Date     Card #3 Fecal Occult Blood, POC     Card #3 Date      Assessment/Plan: Encounter for annual routine gynecological examination  Cervical cancer screening - Plan: Cytology - PAP  Screening for HPV (human papillomavirus) - Plan: Cytology - PAP  Adenocarcinoma in situ (AIS) of uterine cervix - Plan: Cytology - PAP; repeat pap today  Encounter for screening mammogram for malignant neoplasm of breast - Plan: MM DIAG BREAST TOMO BILATERAL, US BREAST LTD UNI LEFT INC AXILLA, US BREAST LTD UNI RIGHT INC AXILLA; [facility]  Family history of breast cancer--genetic testing not indicated. Will cont to follow FH.  Breast pain, left - Plan: MM DIAG BREAST TOMO BILATERAL, US BREAST LTD UNI LEFT INC AXILLA, US BREAST LTD UNI RIGHT INC AXILLA; LT breast, check dx mammo and u/s. Will f/u with results.   Mixed hyperlipidemia - Plan: Comprehensive metabolic panel, Lipid panel; check labs. If WNL, will decrease lipitor dose to 10 mg.   Mass of anus - Plan: POCT Occult Blood Stool; Neg FOBT. Question ext hemorrhoid vs other. Pt to do sitz baths and f/u  if sx persist/worsen for GI ref.  Anxiety - Rx RF buspar QD to BID. - Plan: busPIRone (BUSPAR) 7.5 MG tablet   Meds ordered this encounter  Medications  . busPIRone (BUSPAR) 7.5 MG tablet    Sig: Take 1 tablet (7.5 mg total) by mouth 2 (two) times daily.    Dispense:  180 tablet    Refill:  3    Order Specific Question:   Supervising Provider    Answer:   Nadara Mustard [[id number]]              GYN counsel breast self exam, adequate intake of calcium and vitamin D, diet and exercise      F/U  Return in about 1 year (around 11/20/2020).  Derrian Rodak B. Zaid Tomes, PA-C 11/21/2019 9:24 AM

## 2019-11-21 ENCOUNTER — Other Ambulatory Visit (HOSPITAL_COMMUNITY)
Admission: RE | Admit: 2019-11-21 | Discharge: 2019-11-21 | Disposition: A | Discharge status: Home / Self Care | Payer: Self-pay | Source: Ambulatory Visit | Attending: Obstetrics and Gynecology | Admitting: Obstetrics and Gynecology

## 2019-11-21 ENCOUNTER — Other Ambulatory Visit: Payer: Self-pay

## 2019-11-21 ENCOUNTER — Encounter: Payer: Self-pay | Admitting: Obstetrics and Gynecology

## 2019-11-21 ENCOUNTER — Ambulatory Visit (INDEPENDENT_AMBULATORY_CARE_PROVIDER_SITE_OTHER): Payer: Medicaid Other | Admitting: Obstetrics and Gynecology

## 2019-11-21 VITALS — BP 120/70 | Ht 64.0 in | Wt 201.0 lb

## 2019-11-21 DIAGNOSIS — Z01411 Encounter for gynecological examination (general) (routine) with abnormal findings: Secondary | ICD-10-CM | POA: Diagnosis not present

## 2019-11-21 DIAGNOSIS — Z124 Encounter for screening for malignant neoplasm of cervix: Secondary | ICD-10-CM

## 2019-11-21 DIAGNOSIS — Z01419 Encounter for gynecological examination (general) (routine) without abnormal findings: Secondary | ICD-10-CM

## 2019-11-21 DIAGNOSIS — Z1231 Encounter for screening mammogram for malignant neoplasm of breast: Secondary | ICD-10-CM | POA: Diagnosis not present

## 2019-11-21 DIAGNOSIS — D069 Carcinoma in situ of cervix, unspecified: Secondary | ICD-10-CM | POA: Insufficient documentation

## 2019-11-21 DIAGNOSIS — Z803 Family history of malignant neoplasm of breast: Secondary | ICD-10-CM

## 2019-11-21 DIAGNOSIS — N644 Mastodynia: Secondary | ICD-10-CM

## 2019-11-21 DIAGNOSIS — Z1151 Encounter for screening for human papillomavirus (HPV): Secondary | ICD-10-CM

## 2019-11-21 DIAGNOSIS — K6289 Other specified diseases of anus and rectum: Secondary | ICD-10-CM

## 2019-11-21 DIAGNOSIS — F419 Anxiety disorder, unspecified: Secondary | ICD-10-CM | POA: Insufficient documentation

## 2019-11-21 DIAGNOSIS — E782 Mixed hyperlipidemia: Secondary | ICD-10-CM

## 2019-11-21 LAB — HEMOCCULT GUIAC POC 1CARD (OFFICE): Fecal Occult Blood, POC: NEGATIVE

## 2019-11-21 MED ORDER — BUSPIRONE HCL 7.5 MG PO TABS: 7.5000 mg | ORAL_TABLET | Freq: Two times a day (BID) | ORAL | 3 refills | Status: DC

## 2019-11-21 NOTE — Addendum Note (Signed)
Addended by: Althea Grimmer B on: 11/21/2019 09:50 AM   Modules accepted: Orders

## 2019-11-22 ENCOUNTER — Encounter: Payer: Self-pay | Admitting: Obstetrics and Gynecology

## 2019-11-22 LAB — COMPREHENSIVE METABOLIC PANEL
ALT: 38 IU/L — ABNORMAL HIGH (ref 0–32)
AST: 16 IU/L (ref 0–40)
Albumin/Globulin Ratio: 2.2 (ref 1.2–2.2)
Albumin: 4.8 g/dL (ref 3.8–4.8)
Alkaline Phosphatase: 123 IU/L — ABNORMAL HIGH (ref 48–121)
BUN/Creatinine Ratio: 14 (ref 9–23)
BUN: 11 mg/dL (ref 6–20)
Bilirubin Total: 0.5 mg/dL (ref 0.0–1.2)
CO2: 21 mmol/L (ref 20–29)
Calcium: 9.7 mg/dL (ref 8.7–10.2)
Chloride: 103 mmol/L (ref 96–106)
Creatinine, Ser: 0.77 mg/dL (ref 0.57–1.00)
GFR calc Af Amer: 114 mL/min/{1.73_m2} (ref >59)
GFR calc non Af Amer: 99 mL/min/{1.73_m2} (ref >59)
Globulin, Total: 2.2 g/dL (ref 1.5–4.5)
Glucose: 95 mg/dL (ref 65–99)
Potassium: 4.4 mmol/L (ref 3.5–5.2)
Sodium: 137 mmol/L (ref 134–144)
Total Protein: 7 g/dL (ref 6.0–8.5)

## 2019-11-22 LAB — CYTOLOGY - PAP
Comment: NEGATIVE
Diagnosis: NEGATIVE
High risk HPV: NEGATIVE

## 2019-11-22 LAB — LIPID PANEL
Chol/HDL Ratio: 3.3 ratio (ref 0.0–4.4)
Cholesterol, Total: 165 mg/dL (ref 100–199)
HDL: 50 mg/dL (ref >39)
LDL Chol Calc (NIH): 92 mg/dL (ref 0–99)
Triglycerides: 130 mg/dL (ref 0–149)
VLDL Cholesterol Cal: 23 mg/dL (ref 5–40)

## 2019-11-22 MED ORDER — ATORVASTATIN CALCIUM 20 MG PO TABS: 20.0000 mg | ORAL_TABLET | Freq: Every day | ORAL | 3 refills | Status: DC

## 2019-11-22 NOTE — Addendum Note (Signed)
Addended by: Althea Grimmer B on: 11/22/2019 09:44 AM   Modules accepted: Orders

## 2019-11-28 ENCOUNTER — Ambulatory Visit: Payer: Self-pay | Attending: Oncology

## 2019-11-28 ENCOUNTER — Other Ambulatory Visit: Payer: Self-pay

## 2019-11-28 ENCOUNTER — Ambulatory Visit
Admission: RE | Admit: 2019-11-28 | Discharge: 2019-11-28 | Disposition: A | Discharge status: Home / Self Care | Payer: Medicaid Other | Source: Ambulatory Visit | Attending: Oncology | Admitting: Oncology

## 2019-11-28 ENCOUNTER — Encounter: Payer: Self-pay | Admitting: *Deleted

## 2019-11-28 VITALS — BP 93/46 | HR 78 | Temp 99.0°F | Ht 63.0 in | Wt 204.0 lb

## 2019-11-28 DIAGNOSIS — N644 Mastodynia: Secondary | ICD-10-CM

## 2019-11-28 NOTE — Progress Notes (Unsigned)
Radiologist reviewed Bir[place] 1 findings with patient.  Working with GIA to determine patient's risk for breast cancer.  Will forward GIS tool when available, and make recommendations based on findings.

## 2019-11-28 NOTE — Progress Notes (Addendum)
  Subjective:     Patient ID: Candace Vaughn, female   DOB: Jul 25, 1980, 39 y.o.   MRN: [id number]  HPI   Review of Systems     Objective:   Physical Exam Chest:     Breasts: Breasts are asymmetrical.        Right: No swelling, bleeding, inverted nipple, mass, nipple discharge, skin change or tenderness.        Left: No swelling, bleeding, inverted nipple, mass, nipple discharge, skin change or tenderness.     Comments: Left breast larger than right.        Assessment:     39 year ol patient with strong family history of breast cancer presents for [facility] screening.  Patient screened, and meets [facility] eligibility.  Patient does not have insurance, Medicare or Medicaid. Instructed patient on breast self awareness using teach back method.  Patient complains of upper outer left breast pain x 3 months.   Risk Assessment    Risk Scores      11/28/2019 12/11/2018   Last edited by: Scarlett Presto, RN [place], Freada Bergeron, CMA   5-year risk: 0.8 % 0.7 %   Lifetime risk: 19.7 % 19.8 %            Plan:     Sent for biateral diagnostic mammogram, and ultrasound .  If [facility] recommends will refer to [facility]

## 2020-11-23 NOTE — Progress Notes (Signed)
PCP:  Rica Records, PA-C   Chief Complaint  Patient presents with   Gynecologic Exam    No concerns    HPI:      [name] is a 40 y.o. No obstetric history on file. who LMP was No LMP recorded. Patient has had a hysterectomy., presents today for her annual examination.  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  She does not have PMB.  Sex activity: single partner, contraception - post menopausal status. No pain/bleeding. Last Pap: 11/21/19 and 11/07/18  Results were: neg cells, neg HPV DNA.  2019 With ASCUS with neg HPV DNA. Paps due Q3 yrs for 25 yrs per [facility]. 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 RT breast cycst on 10/20 mammo and u/s. Doing through [facility] program. Pt told by Dr. Orlie Dakin that mammos due as soon as FH breast cancer identified.  There is a FH of breast cancer in her mom and mat grt aunt, genetic testing not indicated for pt. There is no FH of ovarian cancer. The patient does do self-breast exams.  Tobacco use: The patient denies current or previous tobacco use. Alcohol use: none No drug use.  Exercise: min active  She does get adequate calcium but stopped Vitamin D in her diet.  She has a hx of hyperlipidemia, on lipitor 20 mg without side effects. Labs due today. Mom with MI in 39s.   Pt also with hx of anxiety. Has excessive worry, feeling nervous at times, sleep disturbance. Takes buspar BID with sx control. Had depression sx with zoloft in past.  FH of anxiety and pt with grief/loss in past. Needs Rx RF.  Pt with hx of thyromegaly without lesions on thyroid u/s 2016 and neg thyroid labs. Due for lab check. If neg, will do thyroid u/s again.   HAS FP MCD  Past Medical History:  Diagnosis Date   Abnormal Pap smear of cervix 09/26/2002   ASC-US   Abnormal Pap smear of cervix 07/26/2002   LGSIL; @[facility]  SUSPICIOUS HGSIL    Adenocarcinoma in situ (AIS) of uterine cervix    Cervical dysplasia 09/26/2002   CINII   Dysmenorrhea    mild   Esophageal reflux    H/O: hysterectomy 06/13/2014   CP JR; TLH/BS   History of abnormal cervical Pap smear 12/02/2014   ASCUS/neg-needs yearly paps for several yrs due to AIS   Hyperlipidemia    started on lipitor 3/14;recheck in 5/14   Postpartum depression    Thyromegaly 2016   no nodules, normal thryroid labs    Past Surgical History:  Procedure Laterality Date   CHOLECYSTECTOMY, LAPAROSCOPIC  12/2009   COLPOSCOPY  09/26/2002   CIN2,HPV+,ECC NEG   SALPINGECTOMY  05/2014    Family History  Problem Relation Age of Onset   Hyperlipidemia Mother    Breast cancer Mother 18   Osteopenia Mother    Heart attack Mother        64s   Colon polyps Mother    COPD Father    Hypertension Father    Hyperlipidemia Sister 69   Dementia Maternal Grandmother        senile   Hyperlipidemia Maternal Grandmother    Leukemia Son        4 months   Breast cancer Maternal Aunt 6       has contact  with her    Social History   Socioeconomic History   Marital status: Married    Spouse name: Not on file   Number of children: Not on file   Years of education: Not on file   Highest education level: Not on file  Occupational History   Not on file  Tobacco Use   Smoking status: Never   Smokeless tobacco: Never  Vaping Use   Vaping Use: Never used  Substance and Sexual Activity   Alcohol use: Yes    Comment: occasionally   Drug use: Never   Sexual activity: Yes    Birth control/protection: Surgical    Comment: Hysterectomy  Other Topics Concern   Not on file  Social History Narrative   Not on file   Social Determinants of Health   Financial Resource Strain: Not on file  Food Insecurity: Not on file  Transportation Needs: Not on file  Physical Activity: Not on file  Stress: Not on file  Social Connections: Not on file  Intimate Partner Violence: Not on file     Outpatient Medications Prior to Visit  Medication Sig Dispense Refill   atorvastatin (LIPITOR) 20 MG tablet Take 1 tablet (20 mg total) by mouth daily. 90 tablet 3   busPIRone (BUSPAR) 7.5 MG tablet Take 1 tablet (7.5 mg total) by mouth 2 (two) times daily. 180 tablet 3   No facility-administered medications prior to visit.      ROS:  Review of Systems  Constitutional:  Negative for fatigue, fever and unexpected weight change.  Respiratory:  Negative for cough, shortness of breath and wheezing.   Cardiovascular:  Negative for chest pain, palpitations and leg swelling.  Gastrointestinal:  Negative for anal bleeding, blood in stool, constipation, diarrhea, nausea and vomiting.  Endocrine: Negative for cold intolerance, heat intolerance and polyuria.  Genitourinary:  Negative for dyspareunia, dysuria, flank pain, frequency, genital sores, hematuria, menstrual problem, pelvic pain, urgency, vaginal bleeding, vaginal discharge and vaginal pain.  Musculoskeletal:  Negative for arthralgias, back pain, joint swelling and myalgias.  Skin:  Negative for rash.  Neurological:  Negative for dizziness, syncope, light-headedness, numbness and headaches.  Hematological:  Negative for adenopathy.  Psychiatric/Behavioral:  Negative for agitation, confusion, sleep disturbance and suicidal ideas. The patient is not nervous/anxious.     Objective: BP 120/70   Ht 5\' 3"  (1.6 m)   Wt 202 lb (91.6 kg)   BMI 35.78 kg/m    Physical Exam Constitutional:      Appearance: She is well-developed.  Genitourinary:     Vulva normal.     Right Labia: No rash, tenderness or lesions.    Left Labia: No tenderness, lesions or rash.    No vaginal discharge, erythema or tenderness.      Right Adnexa: not tender and no mass present.    Left Adnexa: not tender and no mass present.    No cervical friability or polyp.     Uterus is not enlarged or tender.  Breasts:    Right: No mass, nipple discharge, skin  change or tenderness.     Left: No mass, nipple discharge, skin change or tenderness.  Neck:     Thyroid: Thyromegaly present.  Cardiovascular:     Rate and Rhythm: Normal rate and regular rhythm.     Heart sounds: Normal heart sounds. No murmur heard. Pulmonary:     Effort: Pulmonary effort is normal.     Breath sounds: Normal breath sounds.  Abdominal:  Palpations: Abdomen is soft.     Tenderness: There is no abdominal tenderness. There is no guarding or rebound.  Musculoskeletal:        General: Normal range of motion.     Cervical back: Normal range of motion.  Lymphadenopathy:     Cervical: No cervical adenopathy.  Neurological:     General: No focal deficit present.     Mental Status: She is alert and oriented to person, place, and time.     Cranial Nerves: No cranial nerve deficit.  Skin:    General: Skin is warm and dry.  Psychiatric:        Mood and Affect: Mood normal.        Behavior: Behavior normal.        Thought Content: Thought content normal.        Judgment: Judgment normal.  Vitals reviewed.    Assessment/Plan: Encounter for annual routine gynecological examination  Adenocarcinoma in situ (AIS) of uterine cervix--paps due Q3 yrs per [facility].   Encounter for screening mammogram for malignant neoplasm of breast--pt doing mammos through [facility]  Family history of breast cancer--pt doesn't qualify for cancer genetic testing but will cont to follow FH.  Blood tests for routine general physical examination - Plan: Lipid panel, Comprehensive metabolic panel  Mixed hyperlipidemia - Plan: Lipid panel; lab check. Will RF meds based on labs.   Enlarged thyroid - Plan: TSH; check labs. If neg, will recheck thyroid u/s; last one done 2016.  Anxiety - Plan: busPIRone (BUSPAR) 7.5 MG tablet; Rx RF.    Meds ordered this encounter  Medications   busPIRone (BUSPAR) 7.5 MG tablet    Sig: Take 1 tablet (7.5 mg total) by mouth 2 (two) times daily.    Dispense:  180  tablet    Refill:  3    Order Specific Question:   Supervising Provider    Answer:   Nadara Mustard [[id number]]              GYN counsel breast self exam, adequate intake of calcium and vitamin D, diet and exercise     F/U  Return in about 1 year (around 11/24/2021).  Candace Socorro B. Ananda Sitzer, PA-C 11/24/2020 9:26 AM

## 2020-11-24 ENCOUNTER — Other Ambulatory Visit: Payer: Self-pay

## 2020-11-24 ENCOUNTER — Encounter: Payer: Self-pay | Admitting: Obstetrics and Gynecology

## 2020-11-24 ENCOUNTER — Ambulatory Visit (INDEPENDENT_AMBULATORY_CARE_PROVIDER_SITE_OTHER): Payer: Medicaid Other | Admitting: Obstetrics and Gynecology

## 2020-11-24 VITALS — BP 120/70 | Ht 63.0 in | Wt 202.0 lb

## 2020-11-24 DIAGNOSIS — D069 Carcinoma in situ of cervix, unspecified: Secondary | ICD-10-CM

## 2020-11-24 DIAGNOSIS — E782 Mixed hyperlipidemia: Secondary | ICD-10-CM

## 2020-11-24 DIAGNOSIS — Z0000 Encounter for general adult medical examination without abnormal findings: Secondary | ICD-10-CM

## 2020-11-24 DIAGNOSIS — Z803 Family history of malignant neoplasm of breast: Secondary | ICD-10-CM | POA: Insufficient documentation

## 2020-11-24 DIAGNOSIS — F419 Anxiety disorder, unspecified: Secondary | ICD-10-CM

## 2020-11-24 DIAGNOSIS — Z01419 Encounter for gynecological examination (general) (routine) without abnormal findings: Secondary | ICD-10-CM | POA: Diagnosis not present

## 2020-11-24 DIAGNOSIS — Z1231 Encounter for screening mammogram for malignant neoplasm of breast: Secondary | ICD-10-CM

## 2020-11-24 DIAGNOSIS — E049 Nontoxic goiter, unspecified: Secondary | ICD-10-CM

## 2020-11-24 MED ORDER — BUSPIRONE HCL 7.5 MG PO TABS: 7.5000 mg | ORAL_TABLET | Freq: Two times a day (BID) | ORAL | 3 refills | Status: DC

## 2020-11-24 NOTE — Patient Instructions (Signed)
I value your feedback and you entrusting us with your care. If you get a [place] [facility] patient survey, I would appreciate you taking the time to let us know about your experience today. Thank you! ? ? ?

## 2020-11-25 ENCOUNTER — Encounter: Payer: Self-pay | Admitting: Obstetrics and Gynecology

## 2020-11-25 LAB — COMPREHENSIVE METABOLIC PANEL
ALT: 26 IU/L (ref 0–32)
AST: 14 IU/L (ref 0–40)
Albumin/Globulin Ratio: 2.1 (ref 1.2–2.2)
Albumin: 4.8 g/dL (ref 3.8–4.8)
Alkaline Phosphatase: 130 IU/L — ABNORMAL HIGH (ref 44–121)
BUN/Creatinine Ratio: 9 (ref 9–23)
BUN: 7 mg/dL (ref 6–20)
Bilirubin Total: 0.5 mg/dL (ref 0.0–1.2)
CO2: 21 mmol/L (ref 20–29)
Calcium: 9.7 mg/dL (ref 8.7–10.2)
Chloride: 102 mmol/L (ref 96–106)
Creatinine, Ser: 0.81 mg/dL (ref 0.57–1.00)
Globulin, Total: 2.3 g/dL (ref 1.5–4.5)
Glucose: 96 mg/dL (ref 65–99)
Potassium: 4.9 mmol/L (ref 3.5–5.2)
Sodium: 137 mmol/L (ref 134–144)
Total Protein: 7.1 g/dL (ref 6.0–8.5)
eGFR: 95 mL/min/{1.73_m2} (ref >59)

## 2020-11-25 LAB — LIPID PANEL
Chol/HDL Ratio: 3 ratio (ref 0.0–4.4)
Cholesterol, Total: 159 mg/dL (ref 100–199)
HDL: 53 mg/dL (ref >39)
LDL Chol Calc (NIH): 85 mg/dL (ref 0–99)
Triglycerides: 116 mg/dL (ref 0–149)
VLDL Cholesterol Cal: 21 mg/dL (ref 5–40)

## 2020-11-25 LAB — TSH: TSH: 1.06 u[IU]/mL (ref 0.450–4.500)

## 2020-11-25 MED ORDER — ATORVASTATIN CALCIUM 20 MG PO TABS: 20.0000 mg | ORAL_TABLET | Freq: Every day | ORAL | 3 refills | Status: DC

## 2020-11-25 NOTE — Addendum Note (Signed)
Addended by: Althea Grimmer B on: 11/25/2020 08:12 AM   Modules accepted: Orders

## 2020-12-04 ENCOUNTER — Other Ambulatory Visit: Payer: Self-pay

## 2020-12-04 ENCOUNTER — Ambulatory Visit
Admission: RE | Admit: 2020-12-04 | Discharge: 2020-12-04 | Disposition: A | Discharge status: Home / Self Care | Payer: Medicaid Other | Source: Ambulatory Visit | Attending: Obstetrics and Gynecology | Admitting: Obstetrics and Gynecology

## 2020-12-04 DIAGNOSIS — E049 Nontoxic goiter, unspecified: Secondary | ICD-10-CM | POA: Insufficient documentation

## 2021-03-14 ENCOUNTER — Encounter: Payer: Self-pay | Admitting: Obstetrics and Gynecology

## 2021-03-31 ENCOUNTER — Other Ambulatory Visit: Payer: Self-pay | Admitting: Obstetrics and Gynecology

## 2021-03-31 ENCOUNTER — Encounter: Payer: Self-pay | Admitting: Obstetrics and Gynecology

## 2021-03-31 DIAGNOSIS — F419 Anxiety disorder, unspecified: Secondary | ICD-10-CM

## 2021-03-31 MED ORDER — BUSPIRONE HCL 7.5 MG PO TABS: 7.5000 mg | ORAL_TABLET | Freq: Three times a day (TID) | ORAL | 1 refills | Status: DC

## 2021-03-31 NOTE — Progress Notes (Signed)
Rx buspar to TID from BID prn increased anxiety sx recently.

## 2021-04-15 DIAGNOSIS — Z419 Encounter for procedure for purposes other than remedying health state, unspecified: Secondary | ICD-10-CM | POA: Diagnosis not present

## 2021-05-12 ENCOUNTER — Encounter: Payer: Self-pay | Admitting: Obstetrics and Gynecology

## 2021-05-12 DIAGNOSIS — L989 Disorder of the skin and subcutaneous tissue, unspecified: Secondary | ICD-10-CM

## 2021-05-13 ENCOUNTER — Telehealth: Payer: Self-pay | Admitting: Obstetrics and Gynecology

## 2021-05-13 DIAGNOSIS — Z419 Encounter for procedure for purposes other than remedying health state, unspecified: Secondary | ICD-10-CM | POA: Diagnosis not present

## 2021-05-13 NOTE — Telephone Encounter (Signed)
Candace Vaughn contacted pt. We can not be PCP and PCP based on her MCD card. I can't refer to PCP ?

## 2021-05-13 NOTE — Telephone Encounter (Signed)
This pt called in along with a [facility] representative.  The dermatologist will not accept the referral bc the referral has to come from a primary care provider.  The pt has you listed as her PCP.  Can you send in a referral for a PCP so that the pt can get the referral for the dermatologist?   ?

## 2021-06-13 DIAGNOSIS — Z419 Encounter for procedure for purposes other than remedying health state, unspecified: Secondary | ICD-10-CM | POA: Diagnosis not present

## 2021-06-30 ENCOUNTER — Other Ambulatory Visit: Payer: Self-pay | Admitting: Obstetrics and Gynecology

## 2021-06-30 DIAGNOSIS — F419 Anxiety disorder, unspecified: Secondary | ICD-10-CM

## 2021-07-13 DIAGNOSIS — Z419 Encounter for procedure for purposes other than remedying health state, unspecified: Secondary | ICD-10-CM | POA: Diagnosis not present

## 2021-07-22 IMAGING — US US BREAST*R* LIMITED INC AXILLA
1 series · 9 of 9 positions shown · non-contrast
Comparison: Previous exam(s).

CLINICAL DATA: Patient recalled from screening for right breast
asymmetry.

EXAM:
DIGITAL DIAGNOSTIC RIGHT MAMMOGRAM WITH CAD AND TOMO
ULTRASOUND RIGHT BREAST

[Series 1: us breast*right* limited inc axilla · 0.06mm/px · 9 of 9 slices shown]
[im 1/9]
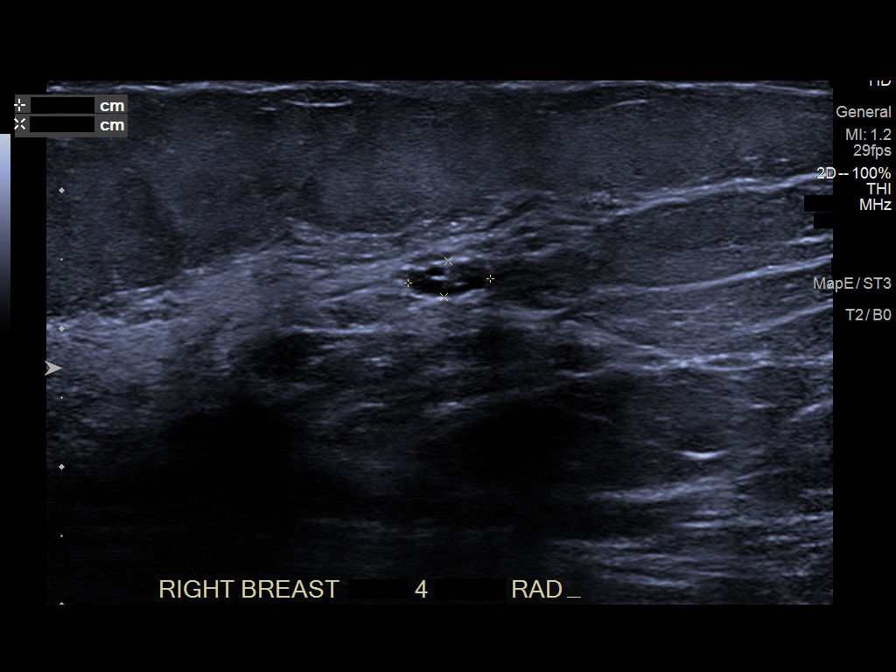
[im 2/9]
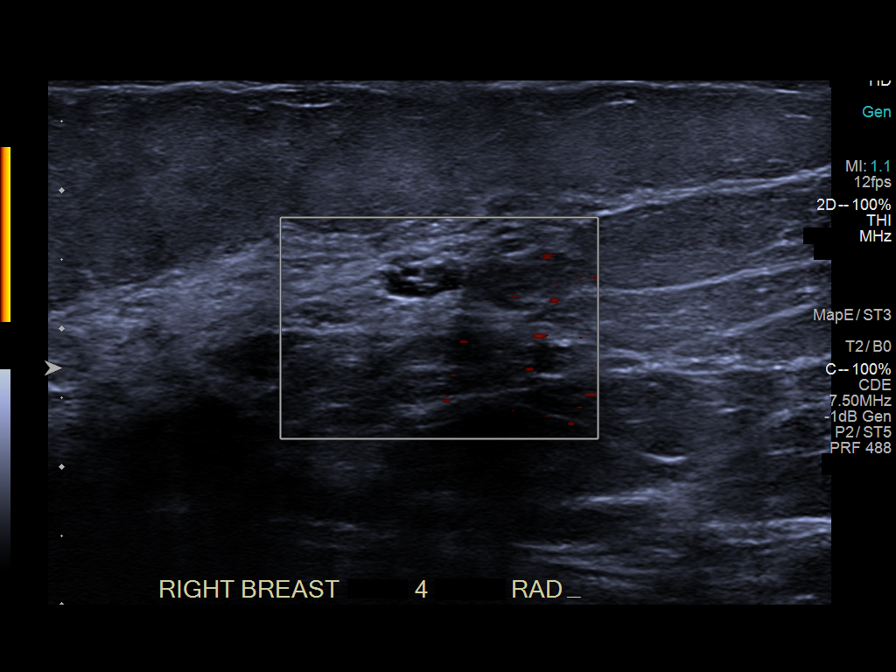
[im 3/9]
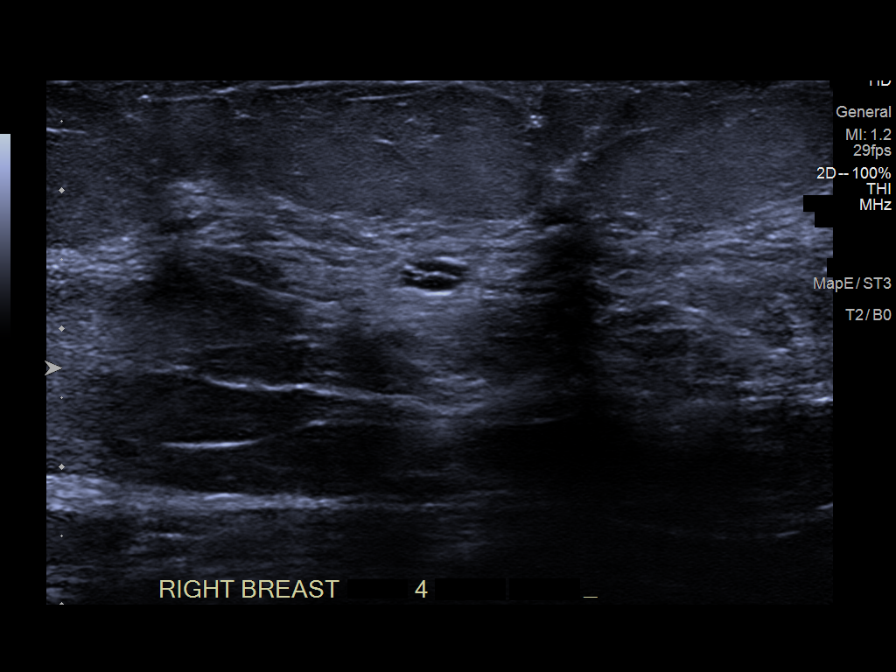
[im 4/9]
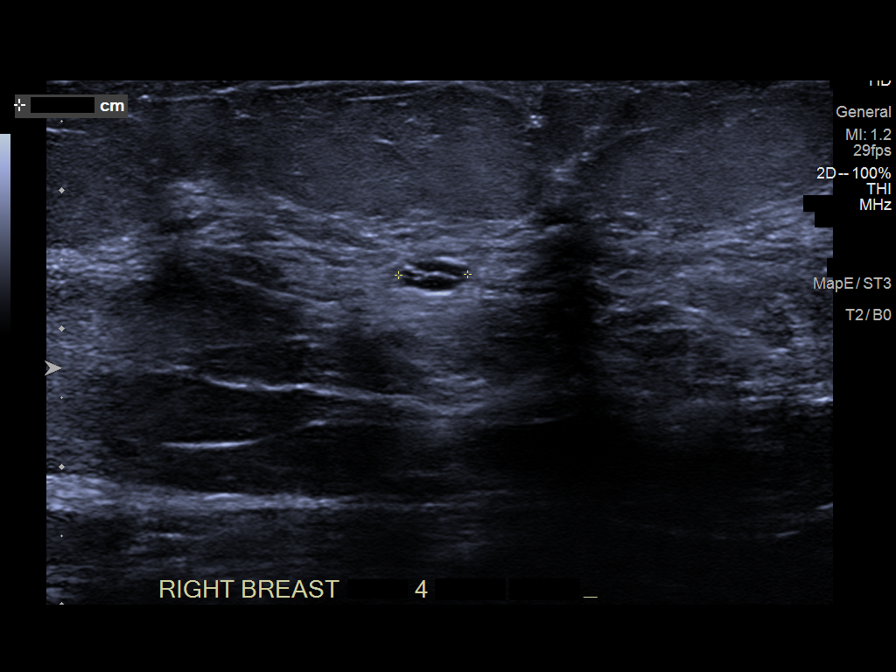
[im 5/9]
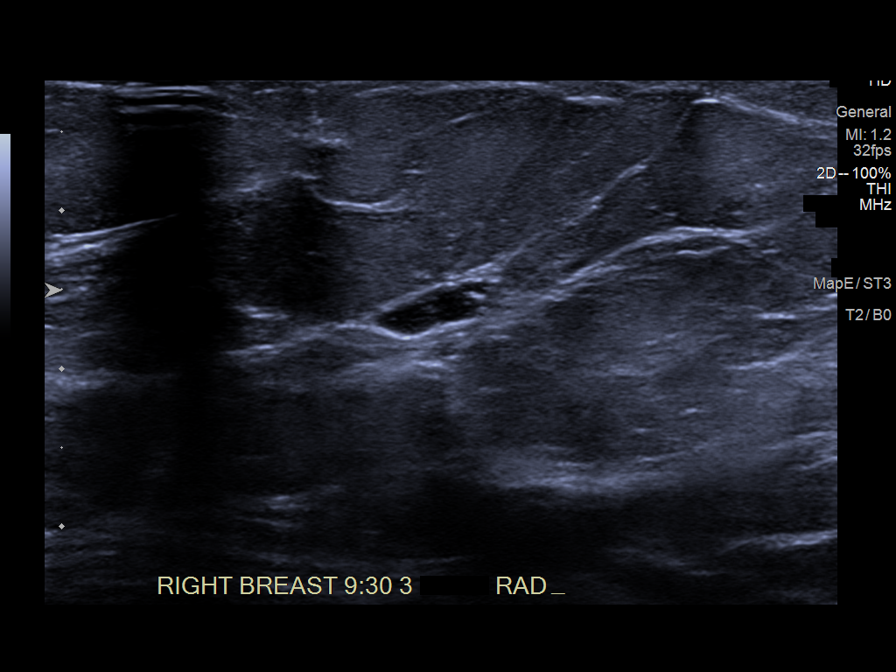
[im 6/9]
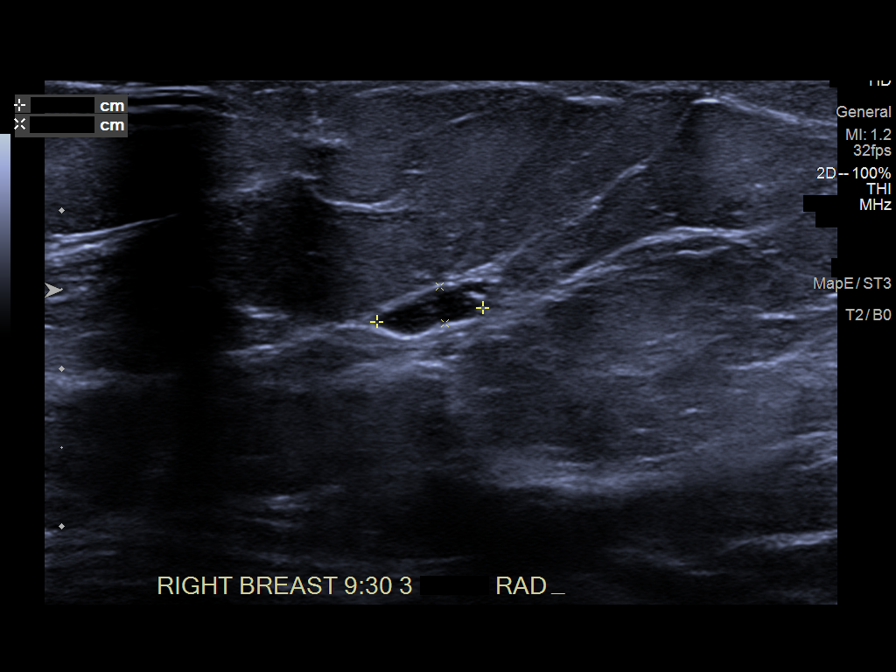
[im 7/9]
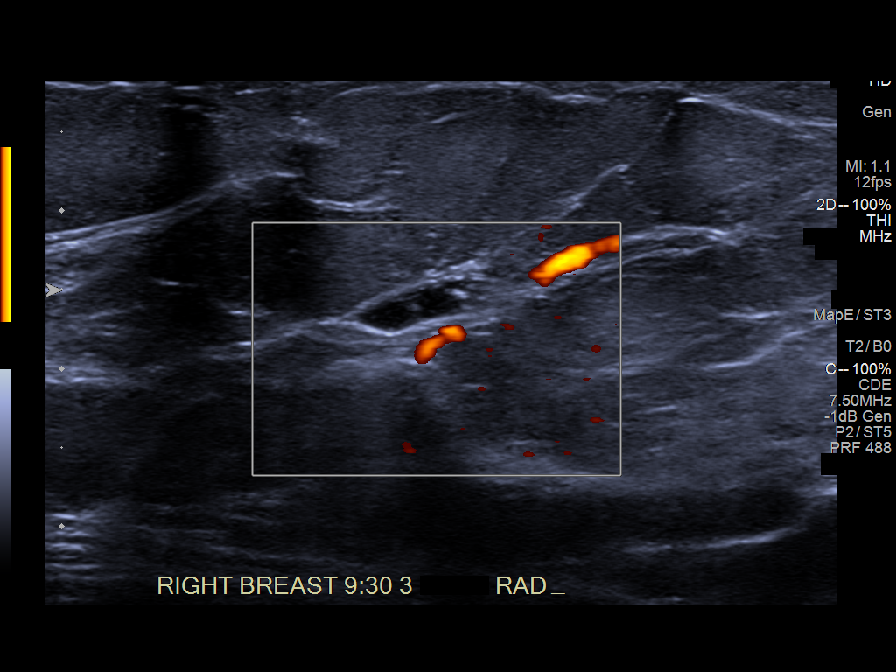
[im 8/9]
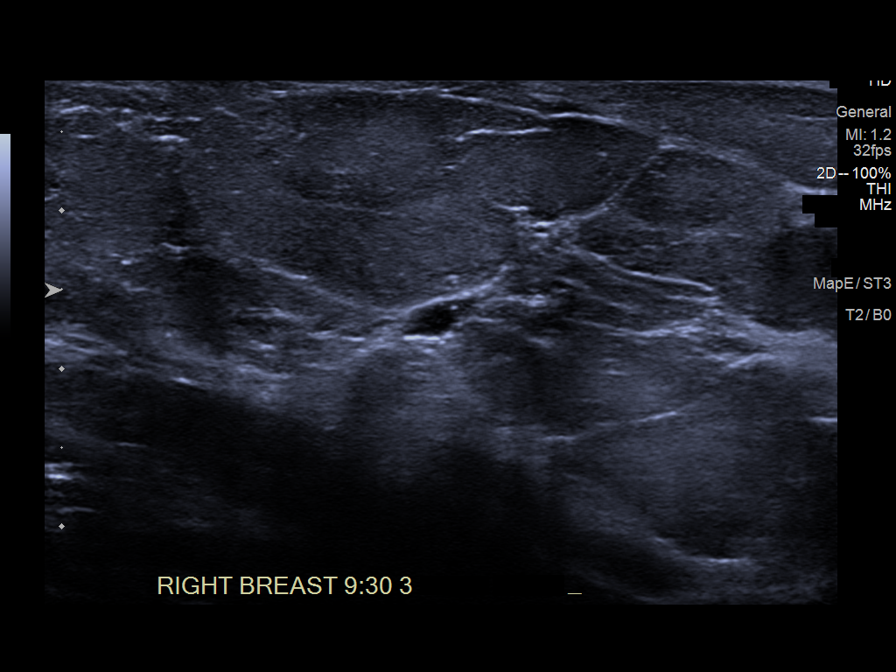
[im 9/9]
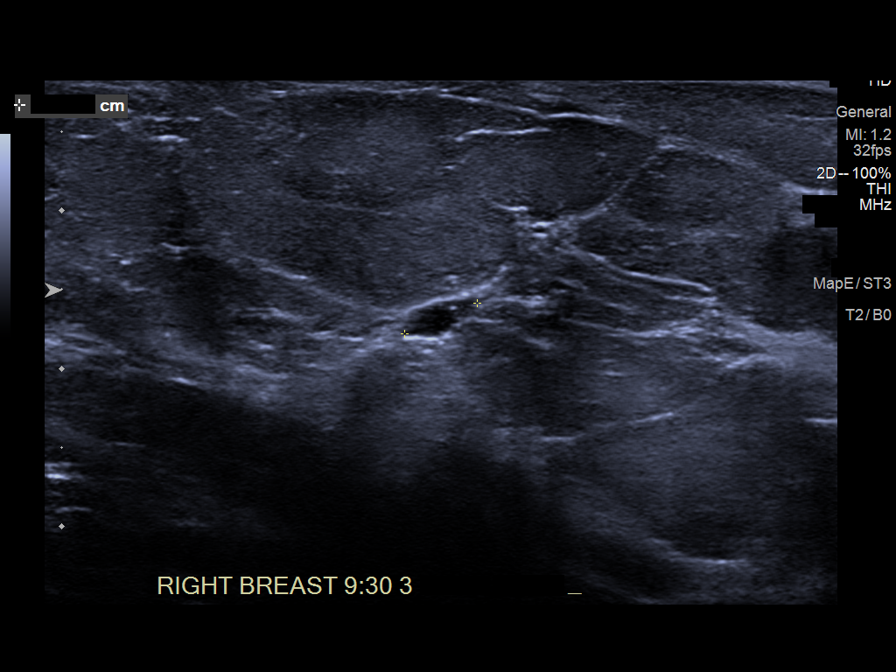

[9 of 9 positions shown; findings below may reference images not displayed]

ACR Breast Density Category c: The breast tissue is heterogeneously
dense, which may obscure small masses.
FINDINGS: Questioned asymmetry within the upper inner right breast
predominately effaced with additional imaging.

There is an additional oval circumscribed low-density mass within
the outer right breast, demonstrated best on the true lateral view.

Mammographic images were processed with CAD.

On physical exam, no discrete mass is palpated within the upper
inner or outer right breast.

Targeted ultrasound is performed, showing a 6 x 3 x 5 mm cluster of
cysts right breast 12 o'clock position 4 cm from nipple.

Within the right breast 9:30 o'clock 3 cm from nipple there is a 7 x
2 x 5 mm cyst.
IMPRESSION: No mammographic evidence for malignancy.

Right breast cysts.

RECOMMENDATION:
Screening mammogram in one year.(Code:AM-X-DBJ)

I have discussed the findings and recommendations with the patient.
If applicable, a reminder letter will be sent to the patient
regarding the next appointment.

BI-RADS CATEGORY  2: Benign.

## 2021-08-12 ENCOUNTER — Encounter: Payer: Self-pay | Admitting: Nurse Practitioner

## 2021-08-12 ENCOUNTER — Ambulatory Visit: Payer: Medicaid Other | Admitting: Nurse Practitioner

## 2021-08-12 ENCOUNTER — Other Ambulatory Visit: Payer: Self-pay

## 2021-08-12 VITALS — BP 120/72 | HR 100 | Temp 98.2°F | Resp 16 | Ht 63.0 in | Wt 208.8 lb

## 2021-08-12 DIAGNOSIS — L989 Disorder of the skin and subcutaneous tissue, unspecified: Secondary | ICD-10-CM

## 2021-08-12 DIAGNOSIS — F419 Anxiety disorder, unspecified: Secondary | ICD-10-CM | POA: Diagnosis not present

## 2021-08-12 DIAGNOSIS — G43009 Migraine without aura, not intractable, without status migrainosus: Secondary | ICD-10-CM | POA: Diagnosis not present

## 2021-08-12 DIAGNOSIS — Z7689 Persons encountering health services in other specified circumstances: Secondary | ICD-10-CM

## 2021-08-12 DIAGNOSIS — E049 Nontoxic goiter, unspecified: Secondary | ICD-10-CM | POA: Diagnosis not present

## 2021-08-12 DIAGNOSIS — E782 Mixed hyperlipidemia: Secondary | ICD-10-CM

## 2021-08-12 MED ORDER — SUMATRIPTAN SUCCINATE 25 MG PO TABS: 50.0000 mg | ORAL_TABLET | ORAL | 0 refills | Status: DC | PRN

## 2021-08-12 MED ORDER — PROPRANOLOL HCL 80 MG PO TABS: 80.0000 mg | ORAL_TABLET | Freq: Every day | ORAL | 0 refills | Status: DC

## 2021-08-12 NOTE — Assessment & Plan Note (Signed)
Continue taking BuSpar 3 times a day.  Consider starting SSRI like Lexapro.

## 2021-08-12 NOTE — Assessment & Plan Note (Signed)
Patient reports she has had migraines for years.  She says it has been increasing in frequency.  We will start on propranolol 80 mg daily for prevention, Imitrex for migraine relief.

## 2021-08-12 NOTE — Progress Notes (Signed)
BP 120/72   Pulse 100   Temp 98.2 F (36.8 C) (Oral)   Resp 16   Ht _0  (1.6 m)   Wt 208 lb 12.8 oz (94.7 kg)   SpO2 97%   BMI 36.99 kg/m    Subjective:    Patient ID: Candace Vaughn, female    DOB: 28-Apr-1980, 41 y.o.   MRN: [id number]  HPI: Candace Vaughn is a 41 y.o. female  Chief Complaint  Patient presents with   Establish Care   Referral    Skin lesion on right side of chest and on left cheek   Establish care: She has been receiving her care at OB/GYN.  He does have a history of hyperlipidemia and anxiety.  Hyperlipidemia: Last LDL was 85 on 11/24/2020.  Her LDL was 161 on 11/03/2017.  Currently taking atorvastatin 20 mg daily.  She denies any myalgia.  Enlarged thyroid: Patient reports she has had an enlarged thyroid for several years now.  Last TSH was in normal range on 11/24/2020.  She has had an ultrasound of her thyroid which showed similar minor thyromegaly without acute finding or nodule.  Discussed with patient that we would continue to monitor this.  Anxiety: She is currently taking BuSpar 7.5 mg 3 times a day. She says that it helps with her anxiety. She says that she has noticed the BuSpar is not working as well as it used to.  Discussed trying an SSRI.  Patient states she tried Paxil and Zoloft and did not like how it made her feel.  Discussed maybe trying Lexapro.  Patient is going to think about it and let us know.    08/12/2021    2:44 PM  GAD 7 : Generalized Anxiety Score  Nervous, Anxious, on Edge 2  Control/stop worrying 0  Worry too much - different things 3  Trouble relaxing 0  Restless 0  Easily annoyed or irritable 0  Afraid - awful might happen 0  Total GAD 7 Score 5  Anxiety Difficulty Not difficult at all        08/12/2021    2:43 PM  Depression screen PHQ 2/9  Decreased Interest 0  Down, Depressed, Hopeless 0  PHQ - 2 Score 0  Altered sleeping 1  Tired, decreased energy 0  Change in appetite 0  Feeling bad or failure  about yourself  0  Trouble concentrating 0  Moving slowly or fidgety/restless 0  Suicidal thoughts 0  PHQ-9 Score 1  Difficult doing work/chores Not difficult at all    Skin lesion: A skin lesion on the left cheek and chest. She says the lesion on her chest has been there for over a year and has changed.  She says the lesion on her face showed up about six months ago.  Referral placed to dermatology for evaluation.   Migraine: She says she has had the migraines for many years. She says that she used to only get one every three months but now she says she is getting a couple a month. She has never taken migraine medication before. She denies any auras.  She does have nausea.  She says she does have some light and sounds sensitivity.  Patient states she has been taking Tylenol and ibuprofen for her migraines.  She says sometimes it helps.  We will start her on propranolol as a preventative and Imitrex for when she has a migraine.  Relevant past medical, surgical, family and social history reviewed and updated  as indicated. Interim medical history since our last visit reviewed. Allergies and medications reviewed and updated.  Review of Systems  Constitutional: Negative for fever or weight change.  Respiratory: Negative for cough and shortness of breath.   Cardiovascular: Negative for chest pain or palpitations.  Gastrointestinal: Negative for abdominal pain, no bowel changes.  Musculoskeletal: Negative for gait problem or joint swelling.  Skin: Negative for rash.  Neurological: Negative for dizziness, positive for headache.  No other specific complaints in a complete review of systems (except as listed in HPI above).      Objective:    BP 120/72   Pulse 100   Temp 98.2 F (36.8 C) (Oral)   Resp 16   Ht _0  (1.6 m)   Wt 208 lb 12.8 oz (94.7 kg)   SpO2 97%   BMI 36.99 kg/m   Wt Readings from Last 3 Encounters:  08/12/21 208 lb 12.8 oz (94.7 kg)  11/24/20 202 lb (91.6 kg)   11/28/19 204 lb (92.5 kg)    Physical Exam  Constitutional: Patient appears well-developed and well-nourished. Obese  No distress.  HEENT: head atraumatic, normocephalic, pupils equal and reactive to light,  neck supple, enlarged thyroid Cardiovascular: Normal rate, regular rhythm and normal heart sounds.  No murmur heard. No BLE edema. Pulmonary/Chest: Effort normal and breath sounds normal. No respiratory distress. Abdominal: Soft.  There is no tenderness. Psychiatric: Patient has a normal mood and affect. behavior is normal. Judgment and thought content normal.  Results for orders placed or performed in visit on 11/24/20  Lipid panel  Result Value Ref Range   Cholesterol, Total 159 100 - 199 mg/dL   Triglycerides 116 0 - 149 mg/dL   HDL 53 >39 mg/dL   VLDL Cholesterol Cal 21 5 - 40 mg/dL   LDL Chol Calc (NIH) 85 0 - 99 mg/dL   Chol/HDL Ratio 3.0 0.0 - 4.4 ratio  Comprehensive metabolic panel  Result Value Ref Range   Glucose 96 65 - 99 mg/dL   BUN 7 6 - 20 mg/dL   Creatinine, Ser 0.81 0.57 - 1.00 mg/dL   eGFR 95 >59 mL/min/1.73   BUN/Creatinine Ratio 9 9 - 23   Sodium 137 134 - 144 mmol/L   Potassium 4.9 3.5 - 5.2 mmol/L   Chloride 102 96 - 106 mmol/L   CO2 21 20 - 29 mmol/L   Calcium 9.7 8.7 - 10.2 mg/dL   Total Protein 7.1 6.0 - 8.5 g/dL   Albumin 4.8 3.8 - 4.8 g/dL   Globulin, Total 2.3 1.5 - 4.5 g/dL   Albumin/Globulin Ratio 2.1 1.2 - 2.2   Bilirubin Total 0.5 0.0 - 1.2 mg/dL   Alkaline Phosphatase 130 (H) 44 - 121 IU/L   AST 14 0 - 40 IU/L   ALT 26 0 - 32 IU/L  TSH  Result Value Ref Range   TSH 1.060 0.450 - 4.500 uIU/mL      Assessment & Plan:   Problem List Items Addressed This Visit       Cardiovascular and Mediastinum   Migraine without aura and without status migrainosus, not intractable    Patient reports she has had migraines for years.  She says it has been increasing in frequency.  We will start on propranolol 80 mg daily for prevention, Imitrex  for migraine relief.       Relevant Medications   propranolol (INDERAL) 80 MG tablet   SUMAtriptan (IMITREX) 25 MG tablet     Endocrine  Enlarged thyroid    Last TSH was normal, ultrasound performed in 2022.  We will continue to monitor.       Relevant Medications   propranolol (INDERAL) 80 MG tablet     Other   Mixed hyperlipidemia    Continue taking atorvastatin 20 mg daily.       Relevant Medications   propranolol (INDERAL) 80 MG tablet   Anxiety    Continue taking BuSpar 3 times a day.  Consider starting SSRI like Lexapro.       Other Visit Diagnoses     Skin lesion    -  Primary   Referral placed for dermatology.   Relevant Orders   Ambulatory referral to Dermatology   Encounter to establish care       Due for physical at the end of the year.        Follow up plan: Return in about 6 months (around 02/11/2022) for follow up.

## 2021-08-12 NOTE — Assessment & Plan Note (Signed)
Continue taking atorvastatin 20 mg daily. 

## 2021-08-12 NOTE — Assessment & Plan Note (Signed)
Last TSH was normal, ultrasound performed in 2022.  We will continue to monitor.

## 2021-08-13 DIAGNOSIS — Z419 Encounter for procedure for purposes other than remedying health state, unspecified: Secondary | ICD-10-CM | POA: Diagnosis not present

## 2021-08-14 ENCOUNTER — Encounter: Payer: Self-pay | Admitting: Nurse Practitioner

## 2021-08-14 ENCOUNTER — Other Ambulatory Visit: Payer: Self-pay | Admitting: Nurse Practitioner

## 2021-08-19 ENCOUNTER — Encounter: Payer: Self-pay | Admitting: Nurse Practitioner

## 2021-08-19 ENCOUNTER — Encounter: Payer: Self-pay | Admitting: Obstetrics and Gynecology

## 2021-09-04 ENCOUNTER — Other Ambulatory Visit: Payer: Self-pay | Admitting: Nurse Practitioner

## 2021-09-04 DIAGNOSIS — G43009 Migraine without aura, not intractable, without status migrainosus: Secondary | ICD-10-CM

## 2021-09-08 ENCOUNTER — Ambulatory Visit (INDEPENDENT_AMBULATORY_CARE_PROVIDER_SITE_OTHER): Payer: Medicaid Other | Admitting: Obstetrics and Gynecology

## 2021-09-08 ENCOUNTER — Encounter: Payer: Self-pay | Admitting: Obstetrics and Gynecology

## 2021-09-08 VITALS — BP 100/70 | Ht 63.0 in | Wt 208.0 lb

## 2021-09-08 DIAGNOSIS — N9089 Other specified noninflammatory disorders of vulva and perineum: Secondary | ICD-10-CM | POA: Diagnosis not present

## 2021-09-08 MED ORDER — CLOTRIMAZOLE-BETAMETHASONE 1-0.05 % EX CREA: TOPICAL_CREAM | CUTANEOUS | 0 refills | Status: DC

## 2021-09-12 DIAGNOSIS — Z419 Encounter for procedure for purposes other than remedying health state, unspecified: Secondary | ICD-10-CM | POA: Diagnosis not present

## 2021-09-19 ENCOUNTER — Other Ambulatory Visit: Payer: Self-pay | Admitting: Obstetrics and Gynecology

## 2021-09-19 DIAGNOSIS — F419 Anxiety disorder, unspecified: Secondary | ICD-10-CM

## 2021-09-24 DIAGNOSIS — F411 Generalized anxiety disorder: Secondary | ICD-10-CM | POA: Diagnosis not present

## 2021-09-28 ENCOUNTER — Other Ambulatory Visit: Payer: Self-pay | Admitting: Nurse Practitioner

## 2021-09-28 DIAGNOSIS — G43009 Migraine without aura, not intractable, without status migrainosus: Secondary | ICD-10-CM

## 2021-09-29 NOTE — Telephone Encounter (Signed)
Requested Prescriptions  Pending Prescriptions Disp Refills  . propranolol (INDERAL) 80 MG tablet [Pharmacy Med Name: Propranolol HCl 80 MG Oral Tablet] 30 tablet 0    Sig: Take 1 tablet by mouth once daily     Cardiovascular:  Beta Blockers Passed - 09/28/2021 10:52 AM      Passed - Last BP in normal range    BP Readings from Last 1 Encounters:  09/08/21 100/70         Passed - Last Heart Rate in normal range    Pulse Readings from Last 1 Encounters:  08/12/21 100         Passed - Valid encounter within last 6 months    Recent Outpatient Visits          1 month ago Skin lesion   [facility] Berniece Salines, FNP      Future Appointments            In 4 months Deirdre Evener, MD [facility]

## 2021-10-06 DIAGNOSIS — F411 Generalized anxiety disorder: Secondary | ICD-10-CM | POA: Diagnosis not present

## 2021-10-13 DIAGNOSIS — F411 Generalized anxiety disorder: Secondary | ICD-10-CM | POA: Diagnosis not present

## 2021-10-13 DIAGNOSIS — Z419 Encounter for procedure for purposes other than remedying health state, unspecified: Secondary | ICD-10-CM | POA: Diagnosis not present

## 2021-10-22 ENCOUNTER — Telehealth: Payer: Self-pay

## 2021-10-22 ENCOUNTER — Other Ambulatory Visit: Payer: Self-pay | Admitting: Obstetrics and Gynecology

## 2021-10-22 DIAGNOSIS — F419 Anxiety disorder, unspecified: Secondary | ICD-10-CM

## 2021-10-22 MED ORDER — BUSPIRONE HCL 7.5 MG PO TABS: 7.5000 mg | ORAL_TABLET | Freq: Three times a day (TID) | ORAL | 0 refills | Status: DC

## 2021-10-22 NOTE — Telephone Encounter (Signed)
Fax Refill Request received for Buspar 7.5mg . Last filled 09/21/2021  #[id number]

## 2021-10-22 NOTE — Telephone Encounter (Signed)
Rx RF eRxd.  

## 2021-10-22 NOTE — Progress Notes (Signed)
Rx RF buspar till 9/23 annual. Needed to increase disp # for TID.

## 2021-10-23 ENCOUNTER — Encounter: Payer: Self-pay | Admitting: Nurse Practitioner

## 2021-10-23 ENCOUNTER — Encounter: Payer: Self-pay | Admitting: Obstetrics and Gynecology

## 2021-10-23 ENCOUNTER — Other Ambulatory Visit: Payer: Self-pay | Admitting: Obstetrics and Gynecology

## 2021-10-23 DIAGNOSIS — F419 Anxiety disorder, unspecified: Secondary | ICD-10-CM

## 2021-10-27 DIAGNOSIS — F411 Generalized anxiety disorder: Secondary | ICD-10-CM | POA: Diagnosis not present

## 2021-11-03 ENCOUNTER — Other Ambulatory Visit: Payer: Self-pay

## 2021-11-03 ENCOUNTER — Ambulatory Visit (INDEPENDENT_AMBULATORY_CARE_PROVIDER_SITE_OTHER): Payer: Medicaid Other | Admitting: Nurse Practitioner

## 2021-11-03 ENCOUNTER — Encounter: Payer: Self-pay | Admitting: Nurse Practitioner

## 2021-11-03 VITALS — BP 112/72 | HR 75 | Temp 98.2°F | Resp 18 | Ht 63.0 in | Wt 210.6 lb

## 2021-11-03 DIAGNOSIS — E782 Mixed hyperlipidemia: Secondary | ICD-10-CM | POA: Diagnosis not present

## 2021-11-03 DIAGNOSIS — Z1159 Encounter for screening for other viral diseases: Secondary | ICD-10-CM | POA: Diagnosis not present

## 2021-11-03 DIAGNOSIS — Z114 Encounter for screening for human immunodeficiency virus [HIV]: Secondary | ICD-10-CM

## 2021-11-03 DIAGNOSIS — Z130 Encounter for screening for diseases of the blood and blood-forming organs and certain disorders involving the immune mechanism: Secondary | ICD-10-CM

## 2021-11-03 DIAGNOSIS — E049 Nontoxic goiter, unspecified: Secondary | ICD-10-CM

## 2021-11-03 DIAGNOSIS — Z0000 Encounter for general adult medical examination without abnormal findings: Secondary | ICD-10-CM

## 2021-11-03 DIAGNOSIS — Z131 Encounter for screening for diabetes mellitus: Secondary | ICD-10-CM | POA: Diagnosis not present

## 2021-11-03 DIAGNOSIS — Z1231 Encounter for screening mammogram for malignant neoplasm of breast: Secondary | ICD-10-CM

## 2021-11-03 DIAGNOSIS — F419 Anxiety disorder, unspecified: Secondary | ICD-10-CM

## 2021-11-03 DIAGNOSIS — F411 Generalized anxiety disorder: Secondary | ICD-10-CM | POA: Diagnosis not present

## 2021-11-03 NOTE — Progress Notes (Signed)
Name: Candace Vaughn   MRN: [id number]    DOB: Dec 16, 1980   Date:11/03/2021       Progress Note  Subjective  Chief Complaint  Chief Complaint  Patient presents with   Annual Exam    HPI  Patient presents for annual CPE.  Diet: Well balanced diet, trying to eat healthy Exercise: she says she has a gym membership, she tries to go three times a week at least, recommended to get 150 min of physical activity a week Sleep: 6-7 hours Last dental exam:6 months Last eye exam: last year  [place] Visit from 11/03/2021 in [facility]  AUDIT-C Score 0      Depression: Phq 9 is  positive, she is currently on Buspar 7.5 mg three times a day as needed for anxiety. She says she has recently started counseling.     11/03/2021    3:12 PM 08/12/2021    2:43 PM  Depression screen PHQ 2/9  Decreased Interest 0 0  Down, Depressed, Hopeless 1 0  PHQ - 2 Score 1 0  Altered sleeping 1 1  Tired, decreased energy 1 0  Change in appetite 0 0  Feeling bad or failure about yourself  0 0  Trouble concentrating 1 0  Moving slowly or fidgety/restless 1 0  Suicidal thoughts 0 0  PHQ-9 Score 5 1  Difficult doing work/chores Not difficult at all Not difficult at all      11/03/2021    3:13 PM 08/12/2021    2:44 PM  GAD 7 : Generalized Anxiety Score  Nervous, Anxious, on Edge 1 2  Control/stop worrying 1 0  Worry too much - different things 1 3  Trouble relaxing 1 0  Restless 1 0  Easily annoyed or irritable 0 0  Afraid - awful might happen 1 0  Total GAD 7 Score 6 5  Anxiety Difficulty Not difficult at all Not difficult at all     Hypertension: BP Readings from Last 3 Encounters:  11/03/21 112/72  09/08/21 100/70  08/12/21 120/72   Obesity: Wt Readings from Last 3 Encounters:  11/03/21 210 lb 9.6 oz (95.5 kg)  09/08/21 208 lb (94.3 kg)  08/12/21 208 lb 12.8 oz (94.7 kg)   BMI Readings from Last 3 Encounters:  11/03/21 37.31 kg/m  09/08/21 36.85  kg/m  08/12/21 36.99 kg/m     Vaccines:  HPV: up to at age 76 , ask insurance if age between 51-45  Shingrix: 101-64 yo and ask insurance if covered when patient above 43 yo Pneumonia:  educated and discussed with patient. Flu:  educated and discussed with patient.  Hep C Screening: ordered STD testing and prevention (HIV/chl/gon/syphilis): ordered Intimate partner violence:none Sexual History :sexually active, one partner Menstrual History/LMP/Abnormal Bleeding: hysterectomy Incontinence Symptoms: stress incontinence  Breast cancer:  - Last Mammogram: 11/28/2019, recommended to start yearly mammograms - BRCA gene screening: none  Osteoporosis: Discussed high calcium and vitamin D supplementation, weight bearing exercises  Cervical cancer screening: 11/21/2019, hysterectomy  Skin cancer: Discussed monitoring for atypical lesions  Colorectal cancer: no concerns, does not qualify   Lung cancer:   Low Dose CT Chest recommended if Age 14-80 years, 20 pack-year currently smoking OR have quit w/in 15years. Patient does not qualify.   ECG: none  Advanced Care Planning: A voluntary discussion about advance care planning including the explanation and discussion of advance directives.  Discussed health care proxy and Living will, and the patient was able to identify  a health care proxy as husband.  Patient does not have a living will at present time. If patient does have living will, I have requested they bring this to the clinic to be scanned in to their chart.  Lipids: Lab Results  Component Value Date   CHOL 159 11/24/2020   CHOL 165 11/21/2019   CHOL 148 11/06/2018   Lab Results  Component Value Date   HDL 53 11/24/2020   HDL 50 11/21/2019   HDL 48 11/06/2018   Lab Results  Component Value Date   LDLCALC 85 11/24/2020   LDLCALC 92 11/21/2019   LDLCALC 79 11/06/2018   Lab Results  Component Value Date   TRIG 116 11/24/2020   TRIG 130 11/21/2019   TRIG 106 11/06/2018    Lab Results  Component Value Date   CHOLHDL 3.0 11/24/2020   CHOLHDL 3.3 11/21/2019   CHOLHDL 3.1 11/06/2018   No results found for: "LDLDIRECT"  Glucose: Glucose  Date Value Ref Range Status  11/24/2020 96 65 - 99 mg/dL Final  11/21/2019 95 65 - 99 mg/dL Final  11/06/2018 87 65 - 99 mg/dL Final    Patient Active Problem List   Diagnosis Date Noted   Migraine without aura and without status migrainosus, not intractable 08/12/2021   Family history of breast cancer 11/24/2020   Enlarged thyroid 11/24/2020   Anxiety 11/21/2019   Abnormal LFTs 11/06/2018   Mixed hyperlipidemia 11/06/2018   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    Past Surgical History:  Procedure Laterality Date   CHOLECYSTECTOMY, LAPAROSCOPIC  12/2009   COLPOSCOPY  09/26/2002   CIN2,HPV+,ECC NEG   SALPINGECTOMY  05/2014    Family History  Problem Relation Age of Onset   Hyperlipidemia Mother    Breast cancer Mother 69   Osteopenia Mother    Heart attack Mother        44s   Colon polyps Mother    COPD Father    Hypertension Father    Hyperlipidemia Sister 41   Dementia Maternal Grandmother        senile   Hyperlipidemia Maternal Grandmother    Leukemia Son        4 months   Breast cancer Maternal Aunt 70       has contact with her    Social History   Socioeconomic History   Marital status: Married    Spouse name: Not on file   Number of children: 2   Years of education: Not on file   Highest education level: Not on file  Occupational History   Not on file  Tobacco Use   Smoking status: Never   Smokeless tobacco: Never  Vaping Use   Vaping Use: Never used  Substance and Sexual Activity   Alcohol use: Yes    Comment: occasionally   Drug use: Never   Sexual activity: Yes    Birth control/protection: Surgical    Comment: Hysterectomy  Other Topics Concern   Not on file  Social History Narrative   Not on file   Social Determinants of Health   Financial  Resource Strain: Low Risk  (11/03/2021)   Overall Financial Resource Strain (CARDIA)    Difficulty of Paying Living Expenses: Not hard at all  Food Insecurity: No Food Insecurity (11/03/2021)   Hunger Vital Sign    Worried About Running Out of Food in the Last Year: Never true    Ran Out of Food in the Last Year: Never true  Transportation Needs: No Transportation Needs (11/03/2021)   PRAPARE - Hydrologist (Medical): No    Lack of Transportation (Non-Medical): No  Physical Activity: Insufficiently Active (11/03/2021)   Exercise Vital Sign    Days of Exercise per Week: 3 days    Minutes of Exercise per Session: 40 min  Stress: Stress Concern Present (11/03/2021)   [place]    Feeling of Stress : To some extent  Social Connections: Moderately Integrated (11/03/2021)   Social Connection and Isolation Panel [NHANES]    Frequency of Communication with Friends and Family: Three times a week    Frequency of Social Gatherings with Friends and Family: Twice a week    Attends Religious Services: 1 to 4 times per year    Active Member of [employer] or Organizations: No    Attends Archivist Meetings: Never    Marital Status: Married  Human resources officer Violence: Not At Risk (11/03/2021)   Humiliation, Afraid, Rape, and Kick questionnaire    Fear of Current or Ex-Partner: No    Emotionally Abused: No    Physically Abused: No    Sexually Abused: No     Current Outpatient Medications:    atorvastatin (LIPITOR) 20 MG tablet, Take 1 tablet (20 mg total) by mouth daily., Disp: 90 tablet, Rfl: 3   busPIRone (BUSPAR) 7.5 MG tablet, Take 1 tablet (7.5 mg total) by mouth 3 (three) times daily., Disp: 270 tablet, Rfl: 0   clotrimazole-betamethasone (LOTRISONE) cream, Apply externally BID prn sx up to 2 wks, Disp: 15 g, Rfl: 0   propranolol (INDERAL) 80 MG tablet, Take 1 tablet by mouth once daily, Disp: 90  tablet, Rfl: 0   SUMAtriptan (IMITREX) 25 MG tablet, Take 2 tablets (50 mg total) by mouth every 2 (two) hours as needed for migraine (ongoing headache). Maximum daily dose 225m, Disp: 30 tablet, Rfl: 0  No Known Allergies   ROS  Constitutional: Negative for fever or weight change.  Respiratory: Negative for cough and shortness of breath.   Cardiovascular: Negative for chest pain or palpitations.  Gastrointestinal: Negative for abdominal pain, no bowel changes.  Musculoskeletal: Negative for gait problem or joint swelling.  Skin: Negative for rash.  Neurological: Negative for dizziness or headache.  No other specific complaints in a complete review of systems (except as listed in HPI above).   Objective  Vitals:   11/03/21 1507  BP: 112/72  Pulse: 75  Resp: 18  Temp: 98.2 F (36.8 C)  TempSrc: Oral  SpO2: 97%  Weight: 210 lb 9.6 oz (95.5 kg)  Height: 5' 3"  (1.6 m)    Body mass index is 37.31 kg/m.  Physical Exam Constitutional: Patient appears well-developed and well-nourished. No distress.  HENT: Head: Normocephalic and atraumatic. Ears: B TMs ok, no erythema or effusion; Nose: Nose normal. Mouth/Throat: Oropharynx is clear and moist. No oropharyngeal exudate.  Eyes: Conjunctivae and EOM are normal. Pupils are equal, round, and reactive to light. No scleral icterus.  Neck: Normal range of motion. Neck supple. No JVD present. No thyromegaly present.  Cardiovascular: Normal rate, regular rhythm and normal heart sounds.  No murmur heard. No BLE edema. Pulmonary/Chest: Effort normal and breath sounds normal. No respiratory distress. Abdominal: Soft. Bowel sounds are normal, no distension. There is no tenderness. no masses Breast: no lumps or masses, no nipple discharge or rashes Musculoskeletal: Normal range of motion, no joint effusions. No gross deformities Neurological: he is  alert and oriented to person, place, and time. No cranial nerve deficit. Coordination, balance,  strength, speech and gait are normal.  Skin: Skin is warm and dry. No rash noted. No erythema.  Psychiatric: Patient has a normal mood and affect. behavior is normal. Judgment and thought content normal.   No results found for this or any previous visit (from the past 2160 hour(s)).    Fall Risk:    11/03/2021    3:10 PM 08/12/2021    2:42 PM  [place] in the past year? 0 0  Number falls in past yr: 0 0  Injury with Fall? 0 0  Follow up Falls evaluation completed Falls evaluation completed     Functional Status Survey: Is the patient deaf or have difficulty hearing?: No Does the patient have difficulty seeing, even when wearing glasses/contacts?: Yes Does the patient have difficulty concentrating, remembering, or making decisions?: No Does the patient have difficulty walking or climbing stairs?: No Does the patient have difficulty dressing or bathing?: No Does the patient have difficulty doing errands alone such as visiting a doctor's office or shopping?: No   Assessment & Plan  1. Annual physical exam  - Lipid panel - CBC with Differential/Platelet - COMPLETE METABOLIC PANEL WITH GFR - Hemoglobin A1c - TSH - Hepatitis C antibody - HIV Antibody (routine testing w rflx)  2. Mixed hyperlipidemia Continue taking atorvastatin 20 mg daily. - Lipid panel  3. Enlarged thyroid  - TSH  4. Encounter for hepatitis C screening test for low risk patient  - Hepatitis C antibody  5. Screening for HIV without presence of risk factors  - HIV Antibody (routine testing w rflx)  6. Anxiety Continue taking BuSpar as needed.  7. Screening for diabetes mellitus  - COMPLETE METABOLIC PANEL WITH GFR - Hemoglobin A1c  8. Screening for deficiency anemia  - CBC with Differential/Platelet  9. Encounter for screening mammogram for malignant neoplasm of breast  - MM 3D SCREEN BREAST BILATERAL; Future   -USPSTF grade A and B recommendations reviewed with patient;  age-appropriate recommendations, preventive care, screening tests, etc discussed and encouraged; healthy living encouraged; see AVS for patient education given to patient -Discussed importance of 150 minutes of physical activity weekly, eat two servings of fish weekly, eat one serving of tree nuts ( cashews, pistachios, pecans, almonds.Marland Kitchen) every other day, eat 6 servings of fruit/vegetables daily and drink plenty of water and avoid sweet beverages.

## 2021-11-12 DIAGNOSIS — F411 Generalized anxiety disorder: Secondary | ICD-10-CM | POA: Diagnosis not present

## 2021-11-13 DIAGNOSIS — Z419 Encounter for procedure for purposes other than remedying health state, unspecified: Secondary | ICD-10-CM | POA: Diagnosis not present

## 2021-11-17 ENCOUNTER — Encounter: Payer: Self-pay | Admitting: Nurse Practitioner

## 2021-11-17 DIAGNOSIS — Z0000 Encounter for general adult medical examination without abnormal findings: Secondary | ICD-10-CM | POA: Diagnosis not present

## 2021-11-17 DIAGNOSIS — Z130 Encounter for screening for diseases of the blood and blood-forming organs and certain disorders involving the immune mechanism: Secondary | ICD-10-CM | POA: Diagnosis not present

## 2021-11-17 DIAGNOSIS — Z1159 Encounter for screening for other viral diseases: Secondary | ICD-10-CM | POA: Diagnosis not present

## 2021-11-17 DIAGNOSIS — E782 Mixed hyperlipidemia: Secondary | ICD-10-CM | POA: Diagnosis not present

## 2021-11-17 DIAGNOSIS — Z131 Encounter for screening for diabetes mellitus: Secondary | ICD-10-CM | POA: Diagnosis not present

## 2021-11-17 DIAGNOSIS — E049 Nontoxic goiter, unspecified: Secondary | ICD-10-CM | POA: Diagnosis not present

## 2021-11-17 DIAGNOSIS — Z114 Encounter for screening for human immunodeficiency virus [HIV]: Secondary | ICD-10-CM | POA: Diagnosis not present

## 2021-11-18 ENCOUNTER — Encounter: Payer: Self-pay | Admitting: Nurse Practitioner

## 2021-11-18 LAB — COMPLETE METABOLIC PANEL WITH GFR
AG Ratio: 2.1 (calc) (ref 1.0–2.5)
ALT: 34 U/L — ABNORMAL HIGH (ref 6–29)
AST: 18 U/L (ref 10–30)
Albumin: 4.7 g/dL (ref 3.6–5.1)
Alkaline phosphatase (APISO): 95 U/L (ref 31–125)
BUN: 9 mg/dL (ref 7–25)
CO2: 25 mmol/L (ref 20–32)
Calcium: 9.9 mg/dL (ref 8.6–10.2)
Chloride: 103 mmol/L (ref 98–110)
Creat: 0.76 mg/dL (ref 0.50–0.97)
Globulin: 2.2 g/dL (calc) (ref 1.9–3.7)
Glucose, Bld: 97 mg/dL (ref 65–99)
Potassium: 5.2 mmol/L (ref 3.5–5.3)
Sodium: 138 mmol/L (ref 135–146)
Total Bilirubin: 0.6 mg/dL (ref 0.2–1.2)
Total Protein: 6.9 g/dL (ref 6.1–8.1)
eGFR: 102 mL/min/{1.73_m2} (ref >=60)

## 2021-11-18 LAB — CBC WITH DIFFERENTIAL/PLATELET
Absolute Monocytes: 319 cells/uL (ref 200–950)
Basophils Absolute: 72 cells/uL (ref 0–200)
Basophils Relative: 1.1 %
Eosinophils Absolute: 72 cells/uL (ref 15–500)
Eosinophils Relative: 1.1 %
HCT: 43.8 % (ref 35.0–45.0)
Hemoglobin: 15 g/dL (ref 11.7–15.5)
Lymphs Abs: 2431 cells/uL (ref 850–3900)
MCH: 30.5 pg (ref 27.0–33.0)
MCHC: 34.2 g/dL (ref 32.0–36.0)
MCV: 89.2 fL (ref 80.0–100.0)
MPV: 10.9 fL (ref 7.5–12.5)
Monocytes Relative: 4.9 %
Neutro Abs: 3608 cells/uL (ref 1500–7800)
Neutrophils Relative %: 55.5 %
Platelets: 264 10*3/uL (ref 140–400)
RBC: 4.91 10*6/uL (ref 3.80–5.10)
RDW: 12 % (ref 11.0–15.0)
Total Lymphocyte: 37.4 %
WBC: 6.5 10*3/uL (ref 3.8–10.8)

## 2021-11-18 LAB — HEMOGLOBIN A1C
Hgb A1c MFr Bld: 5.5 % of total Hgb (ref <5.7)
Mean Plasma Glucose: 111 mg/dL
eAG (mmol/L): 6.2 mmol/L

## 2021-11-18 LAB — LIPID PANEL
Cholesterol: 207 mg/dL — ABNORMAL HIGH (ref <200)
HDL: 50 mg/dL (ref >=50)
LDL Cholesterol (Calc): 123 mg/dL (calc) — ABNORMAL HIGH
Non-HDL Cholesterol (Calc): 157 mg/dL (calc) — ABNORMAL HIGH (ref <130)
Total CHOL/HDL Ratio: 4.1 (calc) (ref <5.0)
Triglycerides: 224 mg/dL — ABNORMAL HIGH (ref <150)

## 2021-11-18 LAB — HEPATITIS C ANTIBODY: Hepatitis C Ab: NONREACTIVE

## 2021-11-18 LAB — TSH: TSH: 1.43 mIU/L

## 2021-11-18 LAB — HIV ANTIBODY (ROUTINE TESTING W REFLEX): HIV 1&2 Ab, 4th Generation: NONREACTIVE

## 2021-11-25 DIAGNOSIS — F411 Generalized anxiety disorder: Secondary | ICD-10-CM | POA: Diagnosis not present

## 2021-11-25 NOTE — Progress Notes (Signed)
PCP:  Berniece Salines, FNP   Chief Complaint  Patient presents with   Gynecologic Exam    No concerns    HPI:      [name] is a 41 y.o. No obstetric history on file. who LMP was No LMP recorded. Patient has had a hysterectomy., presents today for her annual examination.  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  She does not have PMB.  Sex activity: single partner, contraception - post menopausal status. No pain/bleeding. Last Pap: 11/21/19 and 11/07/18  Results were: neg cells, neg HPV DNA.  2019 With ASCUS with neg HPV DNA. Paps due Q3 yrs for 25 yrs per [facility]. 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 RT breast cycst on 10/20 mammo and u/s. Was doing through [employer] program. Has appt 9/23 There is a FH of breast cancer in her mom and mat grt aunt, genetic testing not indicated for pt. There is no FH of ovarian cancer. The patient does do self-breast exams.  Tobacco use: The patient denies current or previous tobacco use. Alcohol use: none No drug use.  Exercise: mod active, just joined gym  She does get adequate calcium and Vitamin D in her diet.  She has a hx of hyperlipidemia, on lipitor 20 mg without side effects. Mom with MI in 67s. Followed now by PCP.   Pt also with hx of anxiety. Has excessive worry, feeling nervous at times, sleep disturbance. Takes buspar TID with sx control. Had depression sx with zoloft in past.  FH of anxiety and pt with grief/loss in past. 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    Dysmenorrhea    mild   Esophageal reflux    H/O: hysterectomy 06/13/2014   CP JR; TLH/BS   History of abnormal cervical Pap smear 12/02/2014   ASCUS/neg-needs yearly paps for several yrs due to AIS   Hyperlipidemia    started on lipitor 3/14;recheck in 5/14   Postpartum depression    Thyromegaly 2016   no nodules, normal thryroid labs    Past Surgical History:  Procedure Laterality Date   CHOLECYSTECTOMY, LAPAROSCOPIC  12/2009   COLPOSCOPY  09/26/2002   CIN2,HPV+,ECC NEG   SALPINGECTOMY  05/2014    Family History  Problem Relation Age of Onset   Hyperlipidemia Mother    Breast cancer Mother 46   Osteopenia Mother    Heart attack Mother        19s   Colon polyps Mother    COPD Father    Hypertension Father    Hyperlipidemia Sister 33   Dementia Maternal Grandmother        senile   Hyperlipidemia Maternal Grandmother    Leukemia Son        4 months   Breast cancer Maternal Aunt 72       has contact with her    Social History   Socioeconomic History   Marital status: Married  Spouse name: Not on file   Number of children: 2   Years of education: Not on file   Highest education level: Not on file  Occupational History   Not on file  Tobacco Use   Smoking status: Never   Smokeless tobacco: Never  Vaping Use   Vaping Use: Never used  Substance and Sexual Activity   Alcohol use: Yes    Comment: occasionally   Drug use: Never   Sexual activity: Yes    Birth control/protection: Surgical    Comment: Hysterectomy  Other Topics Concern   Not on file  Social History Narrative   Not on file   Social Determinants of Health   Financial Resource Strain: Low Risk  (11/03/2021)   Overall Financial Resource Strain (CARDIA)    Difficulty of Paying Living Expenses: Not hard at all  Food Insecurity: No Food Insecurity (11/03/2021)   Hunger Vital Sign    Worried About Running Out of Food in the Last Year: Never true    Ran Out of Food in the Last Year: Never  true  Transportation Needs: No Transportation Needs (11/03/2021)   PRAPARE - Transportation    Lack of Transportation (Medical): No    Lack of Transportation (Non-Medical): No  Physical Activity: Insufficiently Active (11/03/2021)   Exercise Vital Sign    Days of Exercise per Week: 3 days    Minutes of Exercise per Session: 40 min  Stress: Stress Concern Present (11/03/2021)   [employer] of Occupational Health - Occupational Stress Questionnaire    Feeling of Stress : To some extent  Social Connections: Moderately Integrated (11/03/2021)   Social Connection and Isolation Panel [NHANES]    Frequency of Communication with Friends and Family: Three times a week    Frequency of Social Gatherings with Friends and Family: Twice a week    Attends Religious Services: 1 to 4 times per year    Active Member of [employer] or Organizations: No    Attends Banker Meetings: Never    Marital Status: Married  Catering manager Violence: Not At Risk (11/03/2021)   Humiliation, Afraid, Rape, and Kick questionnaire    Fear of Current or Ex-Partner: No    Emotionally Abused: No    Physically Abused: No    Sexually Abused: No    Outpatient Medications Prior to Visit  Medication Sig Dispense Refill   atorvastatin (LIPITOR) 20 MG tablet Take 1 tablet (20 mg total) by mouth daily. 90 tablet 3   busPIRone (BUSPAR) 7.5 MG tablet Take 1 tablet (7.5 mg total) by mouth 3 (three) times daily. 270 tablet 0   clotrimazole-betamethasone (LOTRISONE) cream Apply externally BID prn sx up to 2 wks 15 g 0   propranolol (INDERAL) 80 MG tablet Take 1 tablet by mouth once daily 90 tablet 0   SUMAtriptan (IMITREX) 25 MG tablet Take 2 tablets (50 mg total) by mouth every 2 (two) hours as needed for migraine (ongoing headache). Maximum daily dose 200mg  30 tablet 0   No facility-administered medications prior to visit.      ROS:  Review of Systems  Constitutional:  Negative for fatigue, fever and unexpected  weight change.  Respiratory:  Negative for cough, shortness of breath and wheezing.   Cardiovascular:  Negative for chest pain, palpitations and leg swelling.  Gastrointestinal:  Negative for anal bleeding, blood in stool, constipation, diarrhea, nausea and vomiting.  Endocrine: Negative for cold intolerance, heat intolerance and polyuria.  Genitourinary:  Negative for dyspareunia, dysuria, flank  pain, frequency, genital sores, hematuria, menstrual problem, pelvic pain, urgency, vaginal bleeding, vaginal discharge and vaginal pain.  Musculoskeletal:  Negative for arthralgias, back pain, joint swelling and myalgias.  Skin:  Negative for rash.  Neurological:  Negative for dizziness, syncope, light-headedness, numbness and headaches.  Hematological:  Negative for adenopathy.  Psychiatric/Behavioral:  Negative for agitation, confusion, sleep disturbance and suicidal ideas. The patient is not nervous/anxious.      Objective: BP 100/60   Ht 5\' 3"  (1.6 m)   Wt 211 lb (95.7 kg)   BMI 37.38 kg/m    Physical Exam Constitutional:      Appearance: She is well-developed.  Genitourinary:     Vulva normal.     Genitourinary Comments: UTERUS/CX SURG REM     Right Labia: No rash, tenderness or lesions.    Left Labia: No tenderness, lesions or rash.    Vaginal cuff intact.    No vaginal discharge, erythema or tenderness.      Right Adnexa: not tender and no mass present.    Left Adnexa: not tender and no mass present.    Cervix is absent.     No cervical friability or polyp.     Uterus is not enlarged or tender.     Uterus is absent.  Breasts:    Right: No mass, nipple discharge, skin change or tenderness.     Left: No mass, nipple discharge, skin change or tenderness.  Neck:     Thyroid: Thyromegaly present.  Cardiovascular:     Rate and Rhythm: Normal rate and regular rhythm.     Heart sounds: Normal heart sounds. No murmur heard. Pulmonary:     Effort: Pulmonary effort is normal.      Breath sounds: Normal breath sounds.  Abdominal:     Palpations: Abdomen is soft.     Tenderness: There is no abdominal tenderness. There is no guarding or rebound.  Musculoskeletal:        General: Normal range of motion.     Cervical back: Normal range of motion.  Lymphadenopathy:     Cervical: No cervical adenopathy.  Neurological:     General: No focal deficit present.     Mental Status: She is alert and oriented to person, place, and time.     Cranial Nerves: No cranial nerve deficit.  Skin:    General: Skin is warm and dry.  Psychiatric:        Mood and Affect: Mood normal.        Behavior: Behavior normal.        Thought Content: Thought content normal.        Judgment: Judgment normal.  Vitals reviewed.     Assessment/Plan: Encounter for annual routine gynecological examination  Adenocarcinoma in situ (AIS) of uterine cervix--pap due next yr  Encounter for screening mammogram for malignant neoplasm of breast; pt has mammo appt  Family history of breast cancer--will cont to follow FH. Doesn't qualify for cancer genetic testing at this time.         GYN counsel breast self exam, adequate intake of calcium and vitamin D, diet and exercise     F/U  Return in about 1 year (around 11/27/2022).  Cosandra Plouffe B. Porche Steinberger, PA-C 11/26/2021 8:48 AM

## 2021-11-26 ENCOUNTER — Encounter: Payer: Self-pay | Admitting: Obstetrics and Gynecology

## 2021-11-26 ENCOUNTER — Ambulatory Visit (INDEPENDENT_AMBULATORY_CARE_PROVIDER_SITE_OTHER): Payer: Medicaid Other | Admitting: Obstetrics and Gynecology

## 2021-11-26 VITALS — BP 100/60 | Ht 63.0 in | Wt 211.0 lb

## 2021-11-26 DIAGNOSIS — E049 Nontoxic goiter, unspecified: Secondary | ICD-10-CM

## 2021-11-26 DIAGNOSIS — D069 Carcinoma in situ of cervix, unspecified: Secondary | ICD-10-CM

## 2021-11-26 DIAGNOSIS — Z803 Family history of malignant neoplasm of breast: Secondary | ICD-10-CM | POA: Diagnosis not present

## 2021-11-26 DIAGNOSIS — Z1231 Encounter for screening mammogram for malignant neoplasm of breast: Secondary | ICD-10-CM | POA: Diagnosis not present

## 2021-11-26 DIAGNOSIS — Z01419 Encounter for gynecological examination (general) (routine) without abnormal findings: Secondary | ICD-10-CM | POA: Diagnosis not present

## 2021-11-26 DIAGNOSIS — E782 Mixed hyperlipidemia: Secondary | ICD-10-CM

## 2021-11-26 DIAGNOSIS — F419 Anxiety disorder, unspecified: Secondary | ICD-10-CM

## 2021-11-26 NOTE — Patient Instructions (Signed)
I value your feedback and you entrusting us with your care. If you get a [facility] patient survey, I would appreciate you taking the time to let us know about your experience today. Thank you! ? ? ?

## 2021-12-03 DIAGNOSIS — F411 Generalized anxiety disorder: Secondary | ICD-10-CM | POA: Diagnosis not present

## 2021-12-10 ENCOUNTER — Ambulatory Visit
Admission: RE | Admit: 2021-12-10 | Discharge: 2021-12-10 | Disposition: A | Discharge status: Home / Self Care | Payer: Medicaid Other | Source: Ambulatory Visit | Attending: Nurse Practitioner | Admitting: Nurse Practitioner

## 2021-12-10 DIAGNOSIS — Z1231 Encounter for screening mammogram for malignant neoplasm of breast: Secondary | ICD-10-CM | POA: Diagnosis not present

## 2021-12-10 DIAGNOSIS — H5213 Myopia, bilateral: Secondary | ICD-10-CM | POA: Diagnosis not present

## 2021-12-13 DIAGNOSIS — Z419 Encounter for procedure for purposes other than remedying health state, unspecified: Secondary | ICD-10-CM | POA: Diagnosis not present

## 2021-12-16 ENCOUNTER — Encounter: Payer: Self-pay | Admitting: Nurse Practitioner

## 2021-12-16 ENCOUNTER — Other Ambulatory Visit: Payer: Self-pay | Admitting: Nurse Practitioner

## 2021-12-16 DIAGNOSIS — E6609 Other obesity due to excess calories: Secondary | ICD-10-CM

## 2021-12-26 ENCOUNTER — Other Ambulatory Visit: Payer: Self-pay | Admitting: Nurse Practitioner

## 2021-12-26 DIAGNOSIS — G43009 Migraine without aura, not intractable, without status migrainosus: Secondary | ICD-10-CM

## 2021-12-28 ENCOUNTER — Other Ambulatory Visit: Payer: Self-pay | Admitting: Nurse Practitioner

## 2021-12-28 ENCOUNTER — Encounter: Payer: Self-pay | Admitting: Nurse Practitioner

## 2021-12-28 DIAGNOSIS — G43009 Migraine without aura, not intractable, without status migrainosus: Secondary | ICD-10-CM

## 2021-12-28 MED ORDER — SUMATRIPTAN SUCCINATE 25 MG PO TABS: 50.0000 mg | ORAL_TABLET | ORAL | 0 refills | Status: DC | PRN

## 2021-12-28 MED ORDER — PROPRANOLOL HCL 80 MG PO TABS: 80.0000 mg | ORAL_TABLET | Freq: Every day | ORAL | 3 refills | Status: DC

## 2021-12-28 NOTE — Telephone Encounter (Signed)
Was not sent to pharmacy on 12/26/2021.    Resent it.  Requested Prescriptions  Pending Prescriptions Disp Refills  . propranolol (INDERAL) 80 MG tablet [Pharmacy Med Name: Propranolol HCl 80 MG Oral Tablet] 90 tablet 0    Sig: Take 1 tablet by mouth once daily     Cardiovascular:  Beta Blockers Passed - 12/26/2021  9:19 AM      Passed - Last BP in normal range    BP Readings from Last 1 Encounters:  11/26/21 100/60         Passed - Last Heart Rate in normal range    Pulse Readings from Last 1 Encounters:  11/03/21 75         Passed - Valid encounter within last 6 months    Recent Outpatient Visits          1 month ago Annual physical exam   [facility] Bo Merino, FNP   4 months ago Skin lesion   [facility] Bo Merino, FNP      Future Appointments            In 1 month Ralene Bathe, MD [place]   In 10 months Reece Packer, Myna Hidalgo, [facility], [facility]

## 2022-01-01 DIAGNOSIS — F411 Generalized anxiety disorder: Secondary | ICD-10-CM | POA: Diagnosis not present

## 2022-01-08 DIAGNOSIS — F411 Generalized anxiety disorder: Secondary | ICD-10-CM | POA: Diagnosis not present

## 2022-01-13 ENCOUNTER — Encounter (INDEPENDENT_AMBULATORY_CARE_PROVIDER_SITE_OTHER): Payer: Self-pay

## 2022-01-13 DIAGNOSIS — Z419 Encounter for procedure for purposes other than remedying health state, unspecified: Secondary | ICD-10-CM | POA: Diagnosis not present

## 2022-01-20 DIAGNOSIS — F411 Generalized anxiety disorder: Secondary | ICD-10-CM | POA: Diagnosis not present

## 2022-01-21 ENCOUNTER — Other Ambulatory Visit: Payer: Self-pay | Admitting: Obstetrics and Gynecology

## 2022-01-21 DIAGNOSIS — F419 Anxiety disorder, unspecified: Secondary | ICD-10-CM

## 2022-01-21 MED ORDER — BUSPIRONE HCL 7.5 MG PO TABS: 7.5000 mg | ORAL_TABLET | Freq: Three times a day (TID) | ORAL | 3 refills | Status: DC

## 2022-02-12 DIAGNOSIS — Z419 Encounter for procedure for purposes other than remedying health state, unspecified: Secondary | ICD-10-CM | POA: Diagnosis not present

## 2022-02-18 ENCOUNTER — Other Ambulatory Visit: Payer: Self-pay | Admitting: Obstetrics and Gynecology

## 2022-02-18 DIAGNOSIS — E782 Mixed hyperlipidemia: Secondary | ICD-10-CM

## 2022-02-19 ENCOUNTER — Other Ambulatory Visit: Payer: Self-pay | Admitting: Nurse Practitioner

## 2022-02-19 ENCOUNTER — Encounter: Payer: Self-pay | Admitting: Nurse Practitioner

## 2022-02-19 DIAGNOSIS — E782 Mixed hyperlipidemia: Secondary | ICD-10-CM

## 2022-02-19 MED ORDER — ATORVASTATIN CALCIUM 20 MG PO TABS: 20.0000 mg | ORAL_TABLET | Freq: Every day | ORAL | 3 refills | Status: DC

## 2022-02-22 ENCOUNTER — Ambulatory Visit (INDEPENDENT_AMBULATORY_CARE_PROVIDER_SITE_OTHER): Payer: Medicaid Other | Admitting: Dermatology

## 2022-02-22 DIAGNOSIS — L820 Inflamed seborrheic keratosis: Secondary | ICD-10-CM

## 2022-02-22 DIAGNOSIS — L578 Other skin changes due to chronic exposure to nonionizing radiation: Secondary | ICD-10-CM

## 2022-02-22 DIAGNOSIS — L821 Other seborrheic keratosis: Secondary | ICD-10-CM

## 2022-02-22 NOTE — Progress Notes (Signed)
   New Patient Visit  Subjective  Candace Vaughn is a 41 y.o. female who presents for the following: Other (New patient - spots of chest and face x a couple of years). The patient has spots, moles and lesions to be evaluated, some may be new or changing and the patient has concerns that these could be cancer.  The following portions of the chart were reviewed this encounter and updated as appropriate:   Tobacco  Allergies  Meds  Problems  Med Hx  Surg Hx  Fam Hx     Review of Systems:  No other skin or systemic complaints except as noted in HPI or Assessment and Plan.  Objective  Well appearing patient in no apparent distress; mood and affect are within normal limits.  A focused examination was performed including face, chest. Relevant physical exam findings are noted in the Assessment and Plan.  Right medial breast x 1, left cheek x 1, right clavicle x 1 (3) Erythematous stuck-on, waxy papule or plaque   Assessment & Plan   Actinic Damage - chronic, secondary to cumulative UV radiation exposure/sun exposure over time - diffuse scaly erythematous macules with underlying dyspigmentation - Recommend daily broad spectrum sunscreen SPF 30+ to sun-exposed areas, reapply every 2 hours as needed.  - Recommend staying in the shade or wearing long sleeves, sun glasses (UVA+UVB protection) and wide brim hats (4-inch brim around the entire circumference of the hat). - Call for new or changing lesions.  Seborrheic Keratoses - Stuck-on, waxy, tan-brown papules and/or plaques  - Benign-appearing - Discussed benign etiology and prognosis. - Observe - Call for any changes  Inflamed seborrheic keratosis (3) Right medial breast x 1, left cheek x 1, right clavicle x 1  Destruction of lesion - Right medial breast x 1, left cheek x 1, right clavicle x 1 Complexity: simple   Destruction method: cryotherapy   Informed consent: discussed and consent obtained   Timeout:  patient name,  date of birth, surgical site, and procedure verified Lesion destroyed using liquid nitrogen: Yes   Region frozen until ice ball extended beyond lesion: Yes   Outcome: patient tolerated procedure well with no complications   Post-procedure details: wound care instructions given     Return if symptoms worsen or fail to improve.  I, Joanie Coddington, CMA, am acting as scribe for Armida Sans, MD . Documentation: I have reviewed the above documentation for accuracy and completeness, and I agree with the above.  Armida Sans, MD

## 2022-02-22 NOTE — Patient Instructions (Signed)
Cryotherapy Aftercare  Wash gently with soap and water everyday.   Apply Vaseline and Band-Aid daily until healed.     Due to recent changes in healthcare laws, you may see results of your pathology and/or laboratory studies on MyChart before the doctors have had a chance to review them. We understand that in some cases there may be results that are confusing or concerning to you. Please understand that not all results are received at the same time and often the doctors may need to interpret multiple results in order to provide you with the best plan of care or course of treatment. Therefore, we ask that you please give us 2 business days to thoroughly review all your results before contacting the office for clarification. Should we see a critical lab result, you will be contacted sooner.   If You Need Anything After Your Visit  If you have any questions or concerns for your doctor, please call our main line at [phone] and press option 4 to reach your doctor's medical assistant. If no one answers, please leave a voicemail as directed and we will return your call as soon as possible. Messages left after 4 pm will be answered the following business day.   You may also send us a message via MyChart. We typically respond to MyChart messages within 1-2 business days.  For prescription refills, please ask your pharmacy to contact our office. Our fax number is [phone].  If you have an urgent issue when the clinic is closed that cannot wait until the next business day, you can page your doctor at the number below.    Please note that while we do our best to be available for urgent issues outside of office hours, we are not available 24/7.   If you have an urgent issue and are unable to reach us, you may choose to seek medical care at your doctor's office, retail clinic, urgent care center, or emergency room.  If you have a medical emergency, please immediately call 911 or go to the  emergency department.  Pager Numbers  - Dr. Kowalski: [phone]  - Dr. Moye: [phone]  - Dr. Stewart: [phone]  In the event of inclement weather, please call our main line at [phone] for an update on the status of any delays or closures.  Dermatology Medication Tips: Please keep the boxes that topical medications come in in order to help keep track of the instructions about where and how to use these. Pharmacies typically print the medication instructions only on the boxes and not directly on the medication tubes.   If your medication is too expensive, please contact our office at [phone] option 4 or send us a message through MyChart.   We are unable to tell what your co-pay for medications will be in advance as this is different depending on your insurance coverage. However, we may be able to find a substitute medication at lower cost or fill out paperwork to get insurance to cover a needed medication.   If a prior authorization is required to get your medication covered by your insurance company, please allow us 1-2 business days to complete this process.  Drug prices often vary depending on where the prescription is filled and some pharmacies may offer cheaper prices.  The website www.goodrx.com contains coupons for medications through different pharmacies. The prices here do not account for what the cost may be with help from insurance (it may be cheaper with your insurance), but the website can   give you the price if you did not use any insurance.  - You can print the associated coupon and take it with your prescription to the pharmacy.  - You may also stop by our office during regular business hours and pick up a GoodRx coupon card.  - If you need your prescription sent electronically to a different pharmacy, notify our office through [facility] MyChart or by phone at [phone] option 4.     Si Usted Necesita Algo Despus de Su Visita  Tambin puede  enviarnos un mensaje a travs de MyChart. Por lo general respondemos a los mensajes de MyChart en el transcurso de 1 a 2 das hbiles.  Para renovar recetas, por favor pida a su farmacia que se ponga en contacto con nuestra oficina. Nuestro nmero de fax es el [phone].  Si tiene un asunto urgente cuando la clnica est cerrada y que no puede esperar hasta el siguiente da hbil, puede llamar/localizar a su doctor(a) al nmero que aparece a continuacin.   Por favor, tenga en cuenta que aunque hacemos todo lo posible para estar disponibles para asuntos urgentes fuera del horario de oficina, no estamos disponibles las 24 horas del da, los 7 das de la semana.   Si tiene un problema urgente y no puede comunicarse con nosotros, puede optar por buscar atencin mdica  en el consultorio de su doctor(a), en una clnica privada, en un centro de atencin urgente o en una sala de emergencias.  Si tiene una emergencia mdica, por favor llame inmediatamente al 911 o vaya a la sala de emergencias.  Nmeros de bper  - Dr. Kowalski: [phone]  - Dra. Moye: [phone]  - Dra. Stewart: [phone]  En caso de inclemencias del tiempo, por favor llame a nuestra lnea principal al [phone] para una actualizacin sobre el estado de cualquier retraso o cierre.  Consejos para la medicacin en dermatologa: Por favor, guarde las cajas en las que vienen los medicamentos de uso tpico para ayudarle a seguir las instrucciones sobre dnde y cmo usarlos. Las farmacias generalmente imprimen las instrucciones del medicamento slo en las cajas y no directamente en los tubos del medicamento.   Si su medicamento es muy caro, por favor, pngase en contacto con nuestra oficina llamando al [phone] y presione la opcin 4 o envenos un mensaje a travs de MyChart.   No podemos decirle cul ser su copago por los medicamentos por adelantado ya que esto es diferente dependiendo de la cobertura de su seguro.  Sin embargo, es posible que podamos encontrar un medicamento sustituto a menor costo o llenar un formulario para que el seguro cubra el medicamento que se considera necesario.   Si se requiere una autorizacin previa para que su compaa de seguros cubra su medicamento, por favor permtanos de 1 a 2 das hbiles para completar este proceso.  Los precios de los medicamentos varan con frecuencia dependiendo del lugar de dnde se surte la receta y alguna farmacias pueden ofrecer precios ms baratos.  El sitio web www.goodrx.com tiene cupones para medicamentos de diferentes farmacias. Los precios aqu no tienen en cuenta lo que podra costar con la ayuda del seguro (puede ser ms barato con su seguro), pero el sitio web puede darle el precio si no utiliz ningn seguro.  - Puede imprimir el cupn correspondiente y llevarlo con su receta a la farmacia.  - Tambin puede pasar por nuestra oficina durante el horario de atencin regular y recoger una tarjeta de cupones de GoodRx.  -   Si necesita que su receta se enve electrnicamente a una farmacia diferente, informe a nuestra oficina a travs de MyChart de [place] o por telfono llamando al [phone] y presione la opcin 4.  

## 2022-03-02 ENCOUNTER — Encounter: Payer: Self-pay | Admitting: Dermatology

## 2022-03-15 DIAGNOSIS — Z419 Encounter for procedure for purposes other than remedying health state, unspecified: Secondary | ICD-10-CM | POA: Diagnosis not present

## 2022-04-01 DIAGNOSIS — F411 Generalized anxiety disorder: Secondary | ICD-10-CM | POA: Diagnosis not present

## 2022-04-15 DIAGNOSIS — Z419 Encounter for procedure for purposes other than remedying health state, unspecified: Secondary | ICD-10-CM | POA: Diagnosis not present

## 2022-05-14 DIAGNOSIS — Z419 Encounter for procedure for purposes other than remedying health state, unspecified: Secondary | ICD-10-CM | POA: Diagnosis not present

## 2022-06-14 DIAGNOSIS — Z419 Encounter for procedure for purposes other than remedying health state, unspecified: Secondary | ICD-10-CM | POA: Diagnosis not present

## 2022-06-18 IMAGING — US US BREAST*L* LIMITED INC AXILLA
1 series · 2 of 2 positions shown · non-contrast
Comparison: Previous exam(s).

CLINICAL DATA: LEFT breast upper outer pain for 3 months. Patient
has had a hysterectomy. Response to Tylenol. History of breast
cancer in her mother.

EXAM:
DIGITAL DIAGNOSTIC BILATERAL MAMMOGRAM WITH TOMO AND CAD; ULTRASOUND
LEFT BREAST LIMITED

[Series 1: us breast*left* limited inc axilla · 0.09mm/px · 2 of 2 slices shown]
[im 1/2]
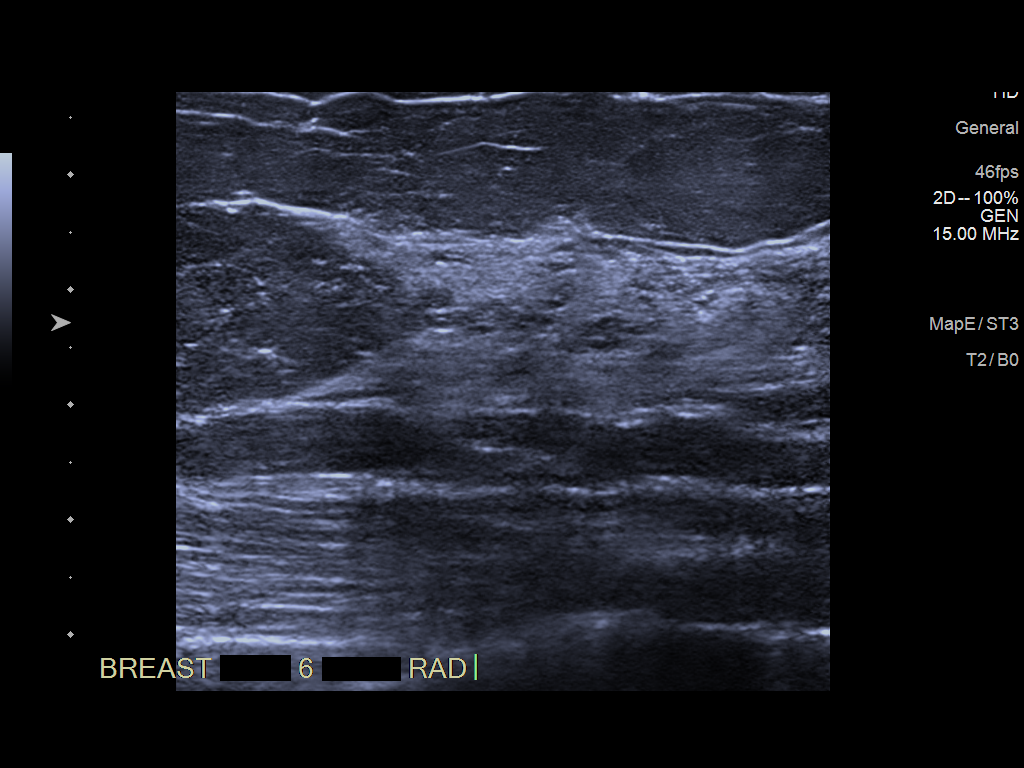
[im 2/2]
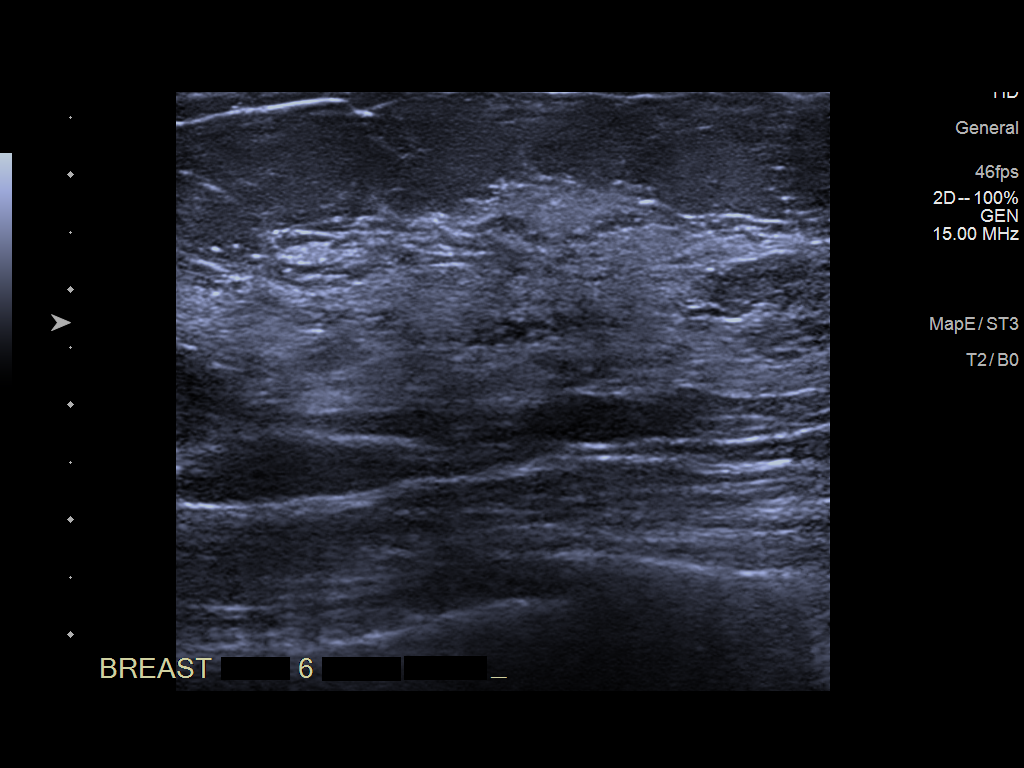

[2 of 2 positions shown; findings below may reference images not displayed]

ACR Breast Density Category c: The breast tissue is heterogeneously
dense, which may obscure small masses.
FINDINGS: Spot compression was obtained over the focal area of pain in the
LEFTbreast. No suspicious mammographic finding is identified in this
area.

Questioned asymmetry in the RIGHT breast resolved with additional
views consistent with overlapping tissue.

No suspicious mass, microcalcification, or other finding is
identified in either breast.

Mammographic images were processed with CAD.

Targeted LEFT breast ultrasound was performed in the area of pain at
the LEFT upper outer breast. No suspicious solid or cystic mass is
identified. No findings to explain the patient's symptoms.
IMPRESSION: 1. No mammographic or sonographic evidence of malignancy at the site
of painful concern in the LEFT breast. Any further workup of the
patient's symptoms should be based on the clinical assessment.
2. No mammographic evidence of malignancy in the bilateral breasts.

RECOMMENDATION:
Screening mammogram in one year.(Code:M5-G-20C). Any further workup
of the patient's symptoms should be based on the clinical
assessment.

The American Cancer Society recommends annual MRI and mammography in
patients with an estimated lifetime risk of developing breast cancer
greater than 20 - 25%, or who are known or suspected to be positive
for the breast cancer gene.

I have discussed the findings and recommendations with the patient.
If applicable, a reminder letter will be sent to the patient
regarding the next appointment.

BI-RADS CATEGORY  1: Negative.

## 2022-07-14 DIAGNOSIS — Z419 Encounter for procedure for purposes other than remedying health state, unspecified: Secondary | ICD-10-CM | POA: Diagnosis not present

## 2022-08-14 DIAGNOSIS — Z419 Encounter for procedure for purposes other than remedying health state, unspecified: Secondary | ICD-10-CM | POA: Diagnosis not present

## 2022-09-13 DIAGNOSIS — Z419 Encounter for procedure for purposes other than remedying health state, unspecified: Secondary | ICD-10-CM | POA: Diagnosis not present

## 2022-10-14 DIAGNOSIS — Z419 Encounter for procedure for purposes other than remedying health state, unspecified: Secondary | ICD-10-CM | POA: Diagnosis not present

## 2022-10-27 ENCOUNTER — Other Ambulatory Visit: Payer: Self-pay | Admitting: Nurse Practitioner

## 2022-10-27 DIAGNOSIS — Z1231 Encounter for screening mammogram for malignant neoplasm of breast: Secondary | ICD-10-CM

## 2022-11-02 NOTE — Progress Notes (Signed)
Name: Candace Vaughn   MRN: [id number]    DOB: 02-04-1981   Date:11/05/2022       Progress Note  Subjective  Chief Complaint  Chief Complaint  Patient presents with   Annual Exam    HPI  Patient presents for annual CPE.  Diet: regular, well balanced diet Exercise: 2 times a week 60 minutes ,  recommend 150 min of physical activity weekly   Last Eye Exam: August 2023 Last Dental Exam: February 2023  Flowsheet Row Office Visit from 11/05/2022 in [facility]  AUDIT-C Score 0      Depression: Phq 9 is  negative    11/05/2022    9:09 AM 11/03/2021    3:12 PM 08/12/2021    2:43 PM  Depression screen PHQ 2/9  Decreased Interest 0 0 0  Down, Depressed, Hopeless 0 1 0  PHQ - 2 Score 0 1 0  Altered sleeping 2 1 1   Tired, decreased energy 0 1 0  Change in appetite 0 0 0  Feeling bad or failure about yourself  0 0 0  Trouble concentrating 0 1 0  Moving slowly or fidgety/restless 0 1 0  Suicidal thoughts 0 0 0  PHQ-9 Score 2 5 1   Difficult doing work/chores Not difficult at all Not difficult at all Not difficult at all   Hypertension: BP Readings from Last 3 Encounters:  11/05/22 122/74  11/26/21 100/60  11/03/21 112/72   Obesity: Wt Readings from Last 3 Encounters:  11/05/22 194 lb 8 oz (88.2 kg)  11/26/21 211 lb (95.7 kg)  11/03/21 210 lb 9.6 oz (95.5 kg)   BMI Readings from Last 3 Encounters:  11/05/22 34.45 kg/m  11/26/21 37.38 kg/m  11/03/21 37.31 kg/m     Vaccines:  HPV: up to at age 68 , ask insurance if age between 55-45  Shingrix: 67-64 yo and ask insurance if covered when patient above 47 yo Pneumonia:  educated and discussed with patient. Flu:  educated and discussed with patient.     Hep C Screening: 06/10/2006 STD testing and prevention (HIV/chl/gon/syphilis): negative at GYN Intimate partner violence: negative screen  Sexual History : yes Menstrual History/LMP/Abnormal Bleeding: hysterectomy Discussed  importance of follow up if any post-menopausal bleeding: yes  Incontinence Symptoms: negative for symptoms   Breast cancer:  - Last Mammogram: September 28,2023 [facility] HX of breast cancer - BRCA gene screening: No  Osteoporosis Prevention : Discussed high calcium and vitamin D supplementation, weight bearing exercises Bone density :not applicable   Cervical cancer screening: has it done at GYN  Skin cancer: Discussed monitoring for atypical lesions  Colorectal cancer: NA , discussed options Lung cancer:  Low Dose CT Chest recommended if Age 55-80 years, 20 pack-year currently smoking OR have quit w/in 15years. Patient does not qualify for screen   ECG:   Advanced Care Planning: A voluntary discussion about advance care planning including the explanation and discussion of advance directives.  Discussed health care proxy and Living will, and the patient was able to identify a health care proxy as husband.  Patient does not have a living will and power of attorney of health care   Lipids: Lab Results  Component Value Date   CHOL 207 (H) 11/17/2021   CHOL 159 11/24/2020   CHOL 165 11/21/2019   Lab Results  Component Value Date   HDL 50 11/17/2021   HDL 53 11/24/2020   HDL 50 11/21/2019   Lab Results  Component Value Date  LDLCALC 123 (H) 11/17/2021   LDLCALC 85 11/24/2020   LDLCALC 92 11/21/2019   Lab Results  Component Value Date   TRIG 224 (H) 11/17/2021   TRIG 116 11/24/2020   TRIG 130 11/21/2019   Lab Results  Component Value Date   CHOLHDL 4.1 11/17/2021   CHOLHDL 3.0 11/24/2020   CHOLHDL 3.3 11/21/2019   No results found for: "LDLDIRECT"  Glucose: Glucose  Date Value Ref Range Status  11/24/2020 96 65 - 99 mg/dL Final  16/12/9602 95 65 - 99 mg/dL Final  54/11/8117 87 65 - 99 mg/dL Final   Glucose, Bld  Date Value Ref Range Status  11/17/2021 97 65 - 99 mg/dL Final    Comment:    .            Fasting reference interval .     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    Past Surgical History:  Procedure Laterality Date   CHOLECYSTECTOMY, LAPAROSCOPIC  12/2009   COLPOSCOPY  09/26/2002   CIN2,HPV+,ECC NEG   SALPINGECTOMY  05/2014    Family History  Problem Relation Age of Onset   Hyperlipidemia Mother    Breast cancer Mother 42   Osteopenia Mother    Heart attack Mother        41s   Colon polyps Mother    COPD Father    Hypertension Father    Hyperlipidemia Sister 9   Dementia Maternal Grandmother        senile   Hyperlipidemia Maternal Grandmother    Leukemia Son        4 months   Breast cancer Maternal Aunt 80       has contact with her    Social History   Socioeconomic History   Marital status: Married    Spouse name: Not on file   Number of children: 2   Years of education: Not on file   Highest education level: Not on file  Occupational History   Not on file  Tobacco Use   Smoking status: Never   Smokeless tobacco: Never  Vaping Use   Vaping status: Never Used  Substance and Sexual Activity   Alcohol use: Yes    Comment: occasionally   Drug use: Never   Sexual activity: Yes    Birth control/protection: Surgical    Comment: Hysterectomy  Other Topics Concern   Not on file  Social History Narrative   Not on file   Social Determinants of Health   Financial Resource Strain: Low Risk  (11/05/2022)   Overall Financial Resource Strain (CARDIA)    Difficulty of Paying Living Expenses: Not hard at all  Food Insecurity: No Food Insecurity (11/05/2022)   Hunger Vital Sign    Worried About Running Out of Food in the Last Year: Never true    Ran Out of Food in the Last Year: Never true   Transportation Needs: No Transportation Needs (11/03/2021)   PRAPARE - Administrator, Civil Service (Medical): No    Lack of Transportation (Non-Medical): No  Physical Activity: Insufficiently Active (11/05/2022)   Exercise Vital Sign    Days of Exercise per Week: 2 days  Minutes of Exercise per Session: 60 min  Stress: No Stress Concern Present (11/05/2022)   [employer] of Occupational Health - Occupational Stress Questionnaire    Feeling of Stress : Only a little  Social Connections: Moderately Isolated (11/05/2022)   Social Connection and Isolation Panel [NHANES]    Frequency of Communication with Friends and Family: More than three times a week    Frequency of Social Gatherings with Friends and Family: More than three times a week    Attends Religious Services: Never    Database administrator or Organizations: No    Attends Banker Meetings: Never    Marital Status: Married  Catering manager Violence: Not At Risk (11/05/2022)   Humiliation, Afraid, Rape, and Kick questionnaire    Fear of Current or Ex-Partner: No    Emotionally Abused: No    Physically Abused: No    Sexually Abused: No     Current Outpatient Medications:    atorvastatin (LIPITOR) 20 MG tablet, Take 1 tablet (20 mg total) by mouth daily., Disp: 90 tablet, Rfl: 3   busPIRone (BUSPAR) 7.5 MG tablet, Take 1 tablet (7.5 mg total) by mouth 3 (three) times daily., Disp: 270 tablet, Rfl: 3   clotrimazole-betamethasone (LOTRISONE) cream, Apply externally BID prn sx up to 2 wks, Disp: 15 g, Rfl: 0   propranolol (INDERAL) 80 MG tablet, Take 1 tablet (80 mg total) by mouth daily., Disp: 90 tablet, Rfl: 3   SUMAtriptan (IMITREX) 25 MG tablet, Take 2 tablets (50 mg total) by mouth every 2 (two) hours as needed for migraine (ongoing headache). Maximum daily dose 200mg , Disp: 30 tablet, Rfl: 0  No Known Allergies   ROS  Constitutional: Negative for fever or weight change.  Respiratory:  Negative for cough and shortness of breath.   Cardiovascular: Negative for chest pain or palpitations.  Gastrointestinal: Negative for abdominal pain, no bowel changes.  Musculoskeletal: Negative for gait problem or joint swelling.  Skin: Negative for rash.  Neurological: Negative for dizziness or headache.  No other specific complaints in a complete review of systems (except as listed in HPI above).   Objective  Vitals:   11/05/22 0859  BP: 122/74  Pulse: 65  Resp: 16  Temp: 97.9 F (36.6 C)  TempSrc: Oral  SpO2: 98%  Weight: 194 lb 8 oz (88.2 kg)  Height: 5\' 3"  (1.6 m)    Body mass index is 34.45 kg/m.  Physical Exam Constitutional: Patient appears well-developed and well-nourished. No distress.  HENT: Head: Normocephalic and atraumatic. Ears: B TMs ok, no erythema or effusion; Nose: Nose normal. Mouth/Throat: Oropharynx is clear and moist. No oropharyngeal exudate.  Eyes: Conjunctivae and EOM are normal. Pupils are equal, round, and reactive to light. No scleral icterus.  Neck: Normal range of motion. Neck supple. No JVD present. No thyromegaly present.  Cardiovascular: Normal rate, regular rhythm and normal heart sounds.  No murmur heard. No BLE edema. Pulmonary/Chest: Effort normal and breath sounds normal. No respiratory distress. Abdominal: Soft. Bowel sounds are normal, no distension. There is no tenderness. no masses Breast: no lumps or masses, no nipple discharge or rashes Musculoskeletal: Normal range of motion, no joint effusions. No gross deformities Neurological: he is alert and oriented to person, place, and time. No cranial nerve deficit. Coordination, balance, strength, speech and gait are normal.  Skin: Skin is warm and dry. No rash noted. No erythema.  Psychiatric: Patient has a normal mood and affect. behavior is normal. Judgment and thought content  normal.   No results found for this or any previous visit (from the past 2160 hour(s)).   Fall Risk:     11/05/2022    9:00 AM 11/03/2021    3:10 PM 08/12/2021    2:42 PM  Fall Risk   Falls in the past year? 0 0 0  Number falls in past yr: 0 0 0  Injury with Fall? 0 0 0  Follow up  Falls evaluation completed Falls evaluation completed     Functional Status Survey: Is the patient deaf or have difficulty hearing?: No Does the patient have difficulty seeing, even when wearing glasses/contacts?: No Does the patient have difficulty concentrating, remembering, or making decisions?: No Does the patient have difficulty walking or climbing stairs?: No Does the patient have difficulty dressing or bathing?: No Does the patient have difficulty doing errands alone such as visiting a doctor's office or shopping?: No   Assessment & Plan  1. Annual physical exam -continue eating well balanced diet -continue physical activity - CBC with Differential/Platelet - COMPLETE METABOLIC PANEL WITH GFR - Lipid panel - Hemoglobin A1c - TSH  2. Mixed hyperlipidemia  - Lipid panel  3. Enlarged thyroid  - TSH  4. Class 1 obesity due to excess calories with serious comorbidity and body mass index (BMI) of 30.0 to 30.9 in adult -continue eating well balanced diet -continue physical activity - TSH  5. Screening for diabetes mellitus  - COMPLETE METABOLIC PANEL WITH GFR - Hemoglobin A1c  6. Screening for deficiency anemia  - CBC with Differential/Platelet    -USPSTF grade A and B recommendations reviewed with patient; age-appropriate recommendations, preventive care, screening tests, etc discussed and encouraged; healthy living encouraged; see AVS for patient education given to patient -Discussed importance of 150 minutes of physical activity weekly, eat two servings of fish weekly, eat one serving of tree nuts ( cashews, pistachios, pecans, almonds.Marland Kitchen) every other day, eat 6 servings of fruit/vegetables daily and drink plenty of water and avoid sweet beverages.   -Reviewed Health Maintenance: Yes.

## 2022-11-05 ENCOUNTER — Other Ambulatory Visit: Payer: Self-pay

## 2022-11-05 ENCOUNTER — Ambulatory Visit (INDEPENDENT_AMBULATORY_CARE_PROVIDER_SITE_OTHER): Payer: Medicaid Other | Admitting: Nurse Practitioner

## 2022-11-05 ENCOUNTER — Encounter: Payer: Self-pay | Admitting: Nurse Practitioner

## 2022-11-05 VITALS — BP 122/74 | HR 65 | Temp 97.9°F | Resp 16 | Ht 63.0 in | Wt 194.5 lb

## 2022-11-05 DIAGNOSIS — E6609 Other obesity due to excess calories: Secondary | ICD-10-CM

## 2022-11-05 DIAGNOSIS — Z130 Encounter for screening for diseases of the blood and blood-forming organs and certain disorders involving the immune mechanism: Secondary | ICD-10-CM | POA: Diagnosis not present

## 2022-11-05 DIAGNOSIS — Z6830 Body mass index (BMI) 30.0-30.9, adult: Secondary | ICD-10-CM

## 2022-11-05 DIAGNOSIS — Z0000 Encounter for general adult medical examination without abnormal findings: Secondary | ICD-10-CM

## 2022-11-05 DIAGNOSIS — Z0001 Encounter for general adult medical examination with abnormal findings: Secondary | ICD-10-CM

## 2022-11-05 DIAGNOSIS — E782 Mixed hyperlipidemia: Secondary | ICD-10-CM | POA: Diagnosis not present

## 2022-11-05 DIAGNOSIS — Z131 Encounter for screening for diabetes mellitus: Secondary | ICD-10-CM

## 2022-11-05 DIAGNOSIS — E049 Nontoxic goiter, unspecified: Secondary | ICD-10-CM | POA: Diagnosis not present

## 2022-11-06 ENCOUNTER — Encounter: Payer: Self-pay | Admitting: Nurse Practitioner

## 2022-11-06 LAB — COMPLETE METABOLIC PANEL WITH GFR
AG Ratio: 2 (calc) (ref 1.0–2.5)
ALT: 18 U/L (ref 6–29)
AST: 14 U/L (ref 10–30)
Albumin: 4.7 g/dL (ref 3.6–5.1)
Alkaline phosphatase (APISO): 109 U/L (ref 31–125)
BUN: 12 mg/dL (ref 7–25)
CO2: 24 mmol/L (ref 20–32)
Calcium: 10.2 mg/dL (ref 8.6–10.2)
Chloride: 107 mmol/L (ref 98–110)
Creat: 0.79 mg/dL (ref 0.50–0.97)
Globulin: 2.4 g/dL (ref 1.9–3.7)
Glucose, Bld: 101 mg/dL — ABNORMAL HIGH (ref 65–99)
Potassium: 5.6 mmol/L — ABNORMAL HIGH (ref 3.5–5.3)
Sodium: 140 mmol/L (ref 135–146)
Total Bilirubin: 0.6 mg/dL (ref 0.2–1.2)
Total Protein: 7.1 g/dL (ref 6.1–8.1)
eGFR: 98 mL/min/{1.73_m2} (ref >=60)

## 2022-11-06 LAB — HEMOGLOBIN A1C
Hgb A1c MFr Bld: 5.6 %{Hb} (ref <5.7)
Mean Plasma Glucose: 114 mg/dL
eAG (mmol/L): 6.3 mmol/L

## 2022-11-06 LAB — CBC WITH DIFFERENTIAL/PLATELET
Absolute Monocytes: 385 {cells}/uL (ref 200–950)
Basophils Absolute: 39 {cells}/uL (ref 0–200)
Basophils Relative: 0.5 %
Eosinophils Absolute: 46 {cells}/uL (ref 15–500)
Eosinophils Relative: 0.6 %
HCT: 44.6 % (ref 35.0–45.0)
Hemoglobin: 15 g/dL (ref 11.7–15.5)
Lymphs Abs: 2734 {cells}/uL (ref 850–3900)
MCH: 30.1 pg (ref 27.0–33.0)
MCHC: 33.6 g/dL (ref 32.0–36.0)
MCV: 89.4 fL (ref 80.0–100.0)
MPV: 11.8 fL (ref 7.5–12.5)
Monocytes Relative: 5 %
Neutro Abs: 4497 {cells}/uL (ref 1500–7800)
Neutrophils Relative %: 58.4 %
Platelets: 239 10*3/uL (ref 140–400)
RBC: 4.99 10*6/uL (ref 3.80–5.10)
RDW: 12 % (ref 11.0–15.0)
Total Lymphocyte: 35.5 %
WBC: 7.7 10*3/uL (ref 3.8–10.8)

## 2022-11-06 LAB — LIPID PANEL
Cholesterol: 146 mg/dL (ref <200)
HDL: 51 mg/dL (ref >=50)
LDL Cholesterol (Calc): 76 mg/dL
Non-HDL Cholesterol (Calc): 95 mg/dL (ref <130)
Total CHOL/HDL Ratio: 2.9 (calc) (ref <5.0)
Triglycerides: 104 mg/dL (ref <150)

## 2022-11-06 LAB — TSH: TSH: 1.68 mIU/L

## 2022-11-14 DIAGNOSIS — Z419 Encounter for procedure for purposes other than remedying health state, unspecified: Secondary | ICD-10-CM | POA: Diagnosis not present

## 2022-12-07 NOTE — Progress Notes (Unsigned)
PCP:  Berniece Salines, FNP   No chief complaint on file.   HPI:      [name] is a 42 y.o. No obstetric history on file. who LMP was No LMP recorded. Patient has had a hysterectomy., presents today for her annual examination.  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  She does not have PMB.  Sex activity: single partner, contraception - post menopausal status. No pain/bleeding. Last Pap: 11/21/19 and 11/07/18  Results were: neg cells, neg HPV DNA.  2019 With ASCUS with neg HPV DNA. Paps due Q3 yrs for 25 yrs per [facility]. Hx of STDs: HPV. Hx of cx AIS 4/16 with subsequent TAH/BS  Mammograms: 12/10/21 results were normal, repeat in 12 months. RT breast cycst on 10/20 mammo and u/s. Has appt 9/24 There is a FH of breast cancer in her mom and mat grt aunt, genetic testing not indicated for pt. There is no FH of ovarian cancer. The patient does do self-breast exams.  Tobacco use: The patient denies current or previous tobacco use. Alcohol use: none No drug use.  Exercise: mod active, just joined gym  She does get adequate calcium and Vitamin D in her diet.  She has a hx of hyperlipidemia, on lipitor 20 mg without side effects. Mom with MI in 21s. Followed now by PCP.   Pt also with hx of anxiety. Has excessive worry, feeling nervous at times, sleep disturbance. Takes buspar TID with sx control. Had depression sx with zoloft in past.  FH of anxiety and pt with grief/loss in past. 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   Dysmenorrhea    mild   Esophageal reflux    H/O: hysterectomy 06/13/2014   CP JR; TLH/BS    History of abnormal cervical Pap smear 12/02/2014   ASCUS/neg-needs yearly paps for several yrs due to AIS   Hyperlipidemia    started on lipitor 3/14;recheck in 5/14   Postpartum depression    Thyromegaly 2016   no nodules, normal thryroid labs    Past Surgical History:  Procedure Laterality Date   CHOLECYSTECTOMY, LAPAROSCOPIC  12/2009   COLPOSCOPY  09/26/2002   CIN2,HPV+,ECC NEG   SALPINGECTOMY  05/2014    Family History  Problem Relation Age of Onset   Hyperlipidemia Mother    Breast cancer Mother 61   Osteopenia Mother    Heart attack Mother        49s   Colon polyps Mother    COPD Father    Hypertension Father    Hyperlipidemia Sister 11   Dementia Maternal Grandmother        senile   Hyperlipidemia Maternal Grandmother    Leukemia Son        4 months   Breast cancer Maternal Aunt 74       has contact with her    Social History   Socioeconomic History   Marital status: Married    Spouse name: Not on file   Number of children: 2   Years of education:  Not on file   Highest education level: Not on file  Occupational History   Not on file  Tobacco Use   Smoking status: Never   Smokeless tobacco: Never  Vaping Use   Vaping status: Never Used  Substance and Sexual Activity   Alcohol use: Yes    Comment: occasionally   Drug use: Never   Sexual activity: Yes    Birth control/protection: Surgical    Comment: Hysterectomy  Other Topics Concern   Not on file  Social History Narrative   Not on file   Social Determinants of Health   Financial Resource Strain: Low Risk  (11/05/2022)   Overall Financial Resource Strain (CARDIA)    Difficulty of Paying Living Expenses: Not hard at all  Food Insecurity: No Food Insecurity (11/05/2022)   Hunger Vital Sign    Worried About Running Out of Food in the Last Year: Never true    Ran Out of Food in the Last Year: Never true  Transportation Needs: No Transportation Needs (11/03/2021)   PRAPARE - Transportation     Lack of Transportation (Medical): No    Lack of Transportation (Non-Medical): No  Physical Activity: Insufficiently Active (11/05/2022)   Exercise Vital Sign    Days of Exercise per Week: 2 days    Minutes of Exercise per Session: 60 min  Stress: No Stress Concern Present (11/05/2022)   [employer] of Occupational Health - Occupational Stress Questionnaire    Feeling of Stress : Only a little  Social Connections: Moderately Isolated (11/05/2022)   Social Connection and Isolation Panel [NHANES]    Frequency of Communication with Friends and Family: More than three times a week    Frequency of Social Gatherings with Friends and Family: More than three times a week    Attends Religious Services: Never    Database administrator or Organizations: No    Attends Banker Meetings: Never    Marital Status: Married  Catering manager Violence: Not At Risk (11/05/2022)   Humiliation, Afraid, Rape, and Kick questionnaire    Fear of Current or Ex-Partner: No    Emotionally Abused: No    Physically Abused: No    Sexually Abused: No    Outpatient Medications Prior to Visit  Medication Sig Dispense Refill   atorvastatin (LIPITOR) 20 MG tablet Take 1 tablet (20 mg total) by mouth daily. 90 tablet 3   busPIRone (BUSPAR) 7.5 MG tablet Take 1 tablet (7.5 mg total) by mouth 3 (three) times daily. 270 tablet 3   clotrimazole-betamethasone (LOTRISONE) cream Apply externally BID prn sx up to 2 wks 15 g 0   propranolol (INDERAL) 80 MG tablet Take 1 tablet (80 mg total) by mouth daily. 90 tablet 3   SUMAtriptan (IMITREX) 25 MG tablet Take 2 tablets (50 mg total) by mouth every 2 (two) hours as needed for migraine (ongoing headache). Maximum daily dose 200mg  30 tablet 0   No facility-administered medications prior to visit.      ROS:  Review of Systems  Constitutional:  Negative for fatigue, fever and unexpected weight change.  Respiratory:  Negative for cough, shortness of breath and  wheezing.   Cardiovascular:  Negative for chest pain, palpitations and leg swelling.  Gastrointestinal:  Negative for anal bleeding, blood in stool, constipation, diarrhea, nausea and vomiting.  Endocrine: Negative for cold intolerance, heat intolerance and polyuria.  Genitourinary:  Negative for dyspareunia, dysuria, flank pain, frequency, genital sores, hematuria, menstrual problem, pelvic pain, urgency, vaginal bleeding, vaginal  discharge and vaginal pain.  Musculoskeletal:  Negative for arthralgias, back pain, joint swelling and myalgias.  Skin:  Negative for rash.  Neurological:  Negative for dizziness, syncope, light-headedness, numbness and headaches.  Hematological:  Negative for adenopathy.  Psychiatric/Behavioral:  Negative for agitation, confusion, sleep disturbance and suicidal ideas. The patient is not nervous/anxious.      Objective: There were no vitals taken for this visit.   Physical Exam Constitutional:      Appearance: She is well-developed.  Genitourinary:     Vulva normal.     Genitourinary Comments: UTERUS/CX SURG REM     Right Labia: No rash, tenderness or lesions.    Left Labia: No tenderness, lesions or rash.    Vaginal cuff intact.    No vaginal discharge, erythema or tenderness.      Right Adnexa: not tender and no mass present.    Left Adnexa: not tender and no mass present.    Cervix is absent.     No cervical friability or polyp.     Uterus is not enlarged or tender.     Uterus is absent.  Breasts:    Right: No mass, nipple discharge, skin change or tenderness.     Left: No mass, nipple discharge, skin change or tenderness.  Neck:     Thyroid: Thyromegaly present.  Cardiovascular:     Rate and Rhythm: Normal rate and regular rhythm.     Heart sounds: Normal heart sounds. No murmur heard. Pulmonary:     Effort: Pulmonary effort is normal.     Breath sounds: Normal breath sounds.  Abdominal:     Palpations: Abdomen is soft.     Tenderness:  There is no abdominal tenderness. There is no guarding or rebound.  Musculoskeletal:        General: Normal range of motion.     Cervical back: Normal range of motion.  Lymphadenopathy:     Cervical: No cervical adenopathy.  Neurological:     General: No focal deficit present.     Mental Status: She is alert and oriented to person, place, and time.     Cranial Nerves: No cranial nerve deficit.  Skin:    General: Skin is warm and dry.  Psychiatric:        Mood and Affect: Mood normal.        Behavior: Behavior normal.        Thought Content: Thought content normal.        Judgment: Judgment normal.  Vitals reviewed.     Assessment/Plan: Encounter for annual routine gynecological examination  Adenocarcinoma in situ (AIS) of uterine cervix--pap due next yr  Encounter for screening mammogram for malignant neoplasm of breast; pt has mammo appt  Family history of breast cancer--will cont to follow FH. Doesn't qualify for cancer genetic testing at this time.         GYN counsel breast self exam, adequate intake of calcium and vitamin D, diet and exercise     F/U  No follow-ups on file.  Gahel Safley B. Alfonzo Arca, PA-C 12/07/2022 11:48 AM

## 2022-12-09 ENCOUNTER — Encounter: Payer: Self-pay | Admitting: Obstetrics and Gynecology

## 2022-12-09 ENCOUNTER — Other Ambulatory Visit (HOSPITAL_COMMUNITY)
Admission: RE | Admit: 2022-12-09 | Discharge: 2022-12-09 | Disposition: A | Discharge status: Home / Self Care | Payer: Medicaid Other | Source: Ambulatory Visit | Attending: Obstetrics and Gynecology | Admitting: Obstetrics and Gynecology

## 2022-12-09 ENCOUNTER — Ambulatory Visit: Payer: Medicaid Other | Admitting: Obstetrics and Gynecology

## 2022-12-09 VITALS — BP 90/59 | HR 55 | Ht 63.0 in | Wt 197.0 lb

## 2022-12-09 DIAGNOSIS — Z124 Encounter for screening for malignant neoplasm of cervix: Secondary | ICD-10-CM | POA: Diagnosis not present

## 2022-12-09 DIAGNOSIS — Z01419 Encounter for gynecological examination (general) (routine) without abnormal findings: Secondary | ICD-10-CM

## 2022-12-09 DIAGNOSIS — Z1151 Encounter for screening for human papillomavirus (HPV): Secondary | ICD-10-CM | POA: Diagnosis not present

## 2022-12-09 DIAGNOSIS — D069 Carcinoma in situ of cervix, unspecified: Secondary | ICD-10-CM

## 2022-12-09 DIAGNOSIS — Z1231 Encounter for screening mammogram for malignant neoplasm of breast: Secondary | ICD-10-CM

## 2022-12-09 NOTE — Patient Instructions (Addendum)
I value your feedback and you entrusting us with your care. If you get a [facility] patient survey, I would appreciate you taking the time to let us know about your experience today. Thank you!  [facility] ([place])--[phone]  

## 2022-12-13 ENCOUNTER — Ambulatory Visit
Admission: RE | Admit: 2022-12-13 | Discharge: 2022-12-13 | Disposition: A | Discharge status: Home / Self Care | Payer: Medicaid Other | Source: Ambulatory Visit | Attending: Nurse Practitioner | Admitting: Nurse Practitioner

## 2022-12-13 DIAGNOSIS — Z1231 Encounter for screening mammogram for malignant neoplasm of breast: Secondary | ICD-10-CM | POA: Diagnosis not present

## 2022-12-14 ENCOUNTER — Encounter: Payer: Self-pay | Admitting: Nurse Practitioner

## 2022-12-14 DIAGNOSIS — Z419 Encounter for procedure for purposes other than remedying health state, unspecified: Secondary | ICD-10-CM | POA: Diagnosis not present

## 2022-12-15 LAB — CYTOLOGY - PAP
Adequacy: ABSENT
Comment: NEGATIVE
Diagnosis: NEGATIVE
Diagnosis: REACTIVE
High risk HPV: NEGATIVE

## 2022-12-16 ENCOUNTER — Other Ambulatory Visit: Payer: Self-pay | Admitting: Nurse Practitioner

## 2022-12-16 DIAGNOSIS — G43009 Migraine without aura, not intractable, without status migrainosus: Secondary | ICD-10-CM

## 2022-12-16 NOTE — Telephone Encounter (Signed)
Requested Prescriptions  Pending Prescriptions Disp Refills   SUMAtriptan (IMITREX) 25 MG tablet [Pharmacy Med Name: SUMAtriptan Succinate 25 MG Oral Tablet] 6 tablet 0    Sig: TAKE 2 TABLETS BY MOUTH EVERY 2 HOURS AS NEEDED FOR  MIGRAINE (ONGOING HEADACHE). MAX DAILY DOSE 200MG      Neurology:  Migraine Therapy - Triptan Passed - 12/16/2022 11:27 AM      Passed - Last BP in normal range    BP Readings from Last 1 Encounters:  12/09/22 (!) 90/59         Passed - Valid encounter within last 12 months    Recent Outpatient Visits           1 month ago Annual physical exam   [facility] Berniece Salines, FNP   1 year ago Annual physical exam   [facility] Berniece Salines, FNP   1 year ago Skin lesion   [facility] Berniece Salines, FNP       Future Appointments             In 6 days Zane Herald, Rudolpho Sevin, FNP [facility]   In 10 months Zane Herald, Rudolpho Sevin, FNP [facility], [facility]

## 2022-12-22 ENCOUNTER — Other Ambulatory Visit: Payer: Self-pay | Admitting: Nurse Practitioner

## 2022-12-22 ENCOUNTER — Other Ambulatory Visit: Payer: Self-pay

## 2022-12-22 ENCOUNTER — Encounter: Payer: Self-pay | Admitting: Nurse Practitioner

## 2022-12-22 ENCOUNTER — Ambulatory Visit: Payer: Medicaid Other | Admitting: Nurse Practitioner

## 2022-12-22 VITALS — BP 118/76 | HR 66 | Temp 98.0°F | Resp 16 | Ht 63.0 in | Wt 198.5 lb

## 2022-12-22 DIAGNOSIS — M79672 Pain in left foot: Secondary | ICD-10-CM

## 2022-12-22 DIAGNOSIS — M79671 Pain in right foot: Secondary | ICD-10-CM | POA: Diagnosis not present

## 2022-12-22 DIAGNOSIS — R059 Cough, unspecified: Secondary | ICD-10-CM

## 2022-12-22 DIAGNOSIS — L719 Rosacea, unspecified: Secondary | ICD-10-CM

## 2022-12-22 DIAGNOSIS — E010 Iodine-deficiency related diffuse (endemic) goiter: Secondary | ICD-10-CM

## 2022-12-22 MED ORDER — OMEPRAZOLE 20 MG PO CPDR
20.0000 mg | DELAYED_RELEASE_CAPSULE | Freq: Every day | ORAL | 0 refills | Status: DC
Start: 2022-12-22 — End: 2023-01-17

## 2022-12-22 NOTE — Progress Notes (Signed)
BP 118/76   Pulse 66   Temp 98 F (36.7 C) (Oral)   Resp 16   Ht 5\' 3"  (1.6 m)   Wt 198 lb 8 oz (90 kg)   SpO2 98%   BMI 35.16 kg/m    Subjective:    Patient ID: Candace Vaughn, female    DOB: [birth year], 42 y.o.   MRN: [id number]  HPI: Candace Vaughn is a 42 y.o. female  Chief Complaint  Patient presents with   Referral    Dermatology for possible Roscea   Rosacea:  patient reports she had a facial this past week.  She says that the tech told her that she may have rosacea and should be seen by dermatology. Patient requesting dermatology referral.  Referral placed.    Postprandial coughing:  she reports after eating she will start coughing. She denies difficulty swallowing or food getting stuck.  Will repeat thyroid ultrasound due to thyroid megaly and start omeprazole for possible gerd.  If no improvement consider ENT vs GI based on ultrasound results.  Bilateral foot and ankle pain:  she reports that she has had bilateral foot pain. No trauama, says it has been going on for a long time.  She also has a nodule on her left foot which may be a ganglion cyst or bone spur. Will refer to podiatry.    Relevant past medical, surgical, family and social history reviewed and updated as indicated. Interim medical history since our last visit reviewed. Allergies and medications reviewed and updated.  Review of Systems  Constitutional: Negative for fever or weight change.  Respiratory: Negative for cough and shortness of breath.   Cardiovascular: Negative for chest pain or palpitations.  Gastrointestinal: Negative for abdominal pain, no bowel changes.  Musculoskeletal: Negative for gait problem or joint swelling. Nodule on left foot, pain to bilateral feet and ankle.  Skin: Negative for rash.  Neurological: Negative for dizziness or headache.  No other specific complaints in a complete review of systems (except as listed in HPI above).      Objective:    BP 118/76    Pulse 66   Temp 98 F (36.7 C) (Oral)   Resp 16   Ht 5\' 3"  (1.6 m)   Wt 198 lb 8 oz (90 kg)   SpO2 98%   BMI 35.16 kg/m   Wt Readings from Last 3 Encounters:  12/22/22 198 lb 8 oz (90 kg)  12/09/22 197 lb (89.4 kg)  11/05/22 194 lb 8 oz (88.2 kg)    Physical Exam  Constitutional: Patient appears well-developed and well-nourished. Obese  No distress.  HEENT: head atraumatic, normocephalic, pupils equal and reactive to light, neck supple Cardiovascular: Normal rate, regular rhythm and normal heart sounds.  No murmur heard. No BLE edema. Pulmonary/Chest: Effort normal and breath sounds normal. No respiratory distress. Abdominal: Soft.  There is no tenderness. Msk: nodule on left foot Psychiatric: Patient has a normal mood and affect. behavior is normal. Judgment and thought content normal.      Assessment & Plan:   Problem List Items Addressed This Visit   None Visit Diagnoses     Rosacea    -  Primary   patient would like to see dermatology for possible rosecea. referral placed.   Relevant Orders   Ambulatory referral to Dermatology   Thyromegaly       patient has noted coughing after eating.  will repeat ultrasound due to thyromegaly.   Relevant Orders  US THYROID   Cough, unspecified type       coughing after eating, start omeprazole,  repeat thyroid ultrasound.  if no improvment will refer to ENT vs GI, determined by [place] results   Relevant Medications   omeprazole (PRILOSEC) 20 MG capsule   Bilateral foot pain       referral placed to podiatry   Relevant Orders   Ambulatory referral to Podiatry        Follow up plan: Return if symptoms worsen or fail to improve.

## 2022-12-23 ENCOUNTER — Ambulatory Visit
Admission: RE | Admit: 2022-12-23 | Discharge: 2022-12-23 | Disposition: A | Discharge status: Home / Self Care | Payer: Medicaid Other | Source: Ambulatory Visit | Attending: Nurse Practitioner | Admitting: Nurse Practitioner

## 2022-12-23 DIAGNOSIS — E010 Iodine-deficiency related diffuse (endemic) goiter: Secondary | ICD-10-CM

## 2022-12-23 DIAGNOSIS — R221 Localized swelling, mass and lump, neck: Secondary | ICD-10-CM | POA: Diagnosis not present

## 2022-12-27 ENCOUNTER — Other Ambulatory Visit: Payer: Self-pay | Admitting: Nurse Practitioner

## 2022-12-27 ENCOUNTER — Encounter: Payer: Self-pay | Admitting: Nurse Practitioner

## 2022-12-27 DIAGNOSIS — R059 Cough, unspecified: Secondary | ICD-10-CM

## 2023-01-11 ENCOUNTER — Ambulatory Visit: Payer: Medicaid Other | Admitting: Podiatry

## 2023-01-11 ENCOUNTER — Encounter: Payer: Self-pay | Admitting: Podiatry

## 2023-01-11 VITALS — BP 100/64 | HR 59

## 2023-01-11 DIAGNOSIS — M722 Plantar fascial fibromatosis: Secondary | ICD-10-CM

## 2023-01-11 NOTE — Progress Notes (Signed)
Subjective:  Patient ID: Candace Vaughn, female    DOB: 04-Aug-1980,  MRN: [id number]  Chief Complaint  Patient presents with   Foot Pain    "I have pain in my feet, all over, the back, in my heel, and on top.  I developed this knot on the side of my left foot."    42 y.o. female presents with the above complaint.  Patient presents with lateral heel pain that has been going for quite some time is progressive gotten worse worse with ambulation worse with pressure she has not seen MRIs prior to seeing me pain scale 7 out of 10 dull achy in nature she stands a lot on her foot hurts when getting out of bed as well.   Review of Systems: Negative except as noted in the HPI. Denies N/V/F/Ch.  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   Dysmenorrhea    mild   Esophageal reflux    H/O: hysterectomy 06/13/2014   CP JR; TLH/BS   History of abnormal cervical Pap smear 12/02/2014   ASCUS/neg-needs yearly paps for several yrs due to AIS   Hyperlipidemia    started on lipitor 3/14;recheck in 5/14   Postpartum depression    Thyromegaly 2016   no nodules, normal thryroid labs    Current Outpatient Medications:    atorvastatin (LIPITOR) 20 MG tablet, Take 1 tablet (20 mg total) by mouth daily., Disp: 90 tablet, Rfl: 3   busPIRone (BUSPAR) 7.5 MG tablet, Take 1 tablet (7.5 mg total) by mouth 3 (three) times daily., Disp: 270 tablet, Rfl: 3   omeprazole (PRILOSEC) 20 MG capsule, Take 1 capsule (20 mg total) by mouth daily., Disp: 30 capsule, Rfl: 0   propranolol (INDERAL) 80 MG tablet, Take 1 tablet (80 mg total) by mouth daily., Disp: 90 tablet, Rfl: 3   SUMAtriptan (IMITREX) 25 MG tablet, TAKE 2 TABLETS BY MOUTH EVERY 2 HOURS AS NEEDED FOR  MIGRAINE (ONGOING HEADACHE). MAX DAILY  DOSE 200MG , Disp: 6 tablet, Rfl: 0   clotrimazole-betamethasone (LOTRISONE) cream, Apply externally BID prn sx up to 2 wks (Patient not taking: Reported on 12/22/2022), Disp: 15 g, Rfl: 0  Social History   Tobacco Use  Smoking Status Never  Smokeless Tobacco Never    No Known Allergies Objective:   Vitals:   01/11/23 1110  BP: 100/64  Pulse: (!) 59   There is no height or weight on file to calculate BMI. Constitutional Well developed. Well nourished.  Vascular Dorsalis pedis pulses palpable bilaterally. Posterior tibial pulses palpable bilaterally. Capillary refill normal to all digits.  No cyanosis or clubbing noted. Pedal hair growth normal.  Neurologic Normal speech. Oriented to person, place, and time. Epicritic sensation to light touch grossly present bilaterally.  Dermatologic Nails well groomed and normal in appearance. No open wounds. No skin lesions.  Orthopedic: Normal joint ROM without pain or crepitus bilaterally. No visible deformities. Tender to palpation at the calcaneal tuber bilaterally. No pain with calcaneal squeeze bilaterally. Ankle ROM diminished range of motion bilaterally. Silfverskiold Test: positive bilaterally.   Radiographs: None  Assessment:  No diagnosis found. Plan:  Patient was evaluated and treated and all questions answered.  Plantar Fasciitis, bilaterally - XR reviewed as above.  - Educated on  icing and stretching. Instructions given.  - Injection delivered to the plantar fascia as below. - DME: Plantar fascial brace dispensed to support the medial longitudinal arch of the foot and offload pressure from the heel and prevent arch collapse during weightbearing - Pharmacologic management: None  Procedure: Injection Tendon/Ligament Location: Bilateral plantar fascia at the glabrous junction; medial approach. Skin Prep: alcohol Injectate: 0.5 cc 0.5% marcaine plain, 0.5 cc of 1% Lidocaine, 0.5 cc kenalog 10. Disposition: Patient  tolerated procedure well. Injection site dressed with a band-aid.  No follow-ups on file.

## 2023-01-14 DIAGNOSIS — Z419 Encounter for procedure for purposes other than remedying health state, unspecified: Secondary | ICD-10-CM | POA: Diagnosis not present

## 2023-01-15 ENCOUNTER — Other Ambulatory Visit: Payer: Self-pay | Admitting: Nurse Practitioner

## 2023-01-15 DIAGNOSIS — R059 Cough, unspecified: Secondary | ICD-10-CM

## 2023-01-16 ENCOUNTER — Other Ambulatory Visit: Payer: Self-pay | Admitting: Nurse Practitioner

## 2023-01-16 ENCOUNTER — Encounter: Payer: Self-pay | Admitting: Podiatry

## 2023-01-16 DIAGNOSIS — G43009 Migraine without aura, not intractable, without status migrainosus: Secondary | ICD-10-CM

## 2023-01-17 NOTE — Telephone Encounter (Signed)
Requested Prescriptions  Pending Prescriptions Disp Refills   omeprazole (PRILOSEC) 20 MG capsule [Pharmacy Med Name: Omeprazole 20 MG Oral Capsule Delayed Release] 90 capsule 0    Sig: Take 1 capsule by mouth once daily     Gastroenterology: Proton Pump Inhibitors Passed - 01/15/2023  9:20 AM      Passed - Valid encounter within last 12 months    Recent Outpatient Visits           3 weeks ago Rosacea   [facility] Berniece Salines, FNP   2 months ago Annual physical exam   [facility] Berniece Salines, FNP   1 year ago Annual physical exam   [facility] Berniece Salines, FNP   1 year ago Skin lesion   [facility] Berniece Salines, FNP       Future Appointments             In 4 months Terri Piedra, DO [facility] Dermatology   In 9 months Zane Herald, Rudolpho Sevin, FNP [facility]   In 11 months Willeen Niece, MD [facility]

## 2023-01-17 NOTE — Telephone Encounter (Signed)
Requested Prescriptions  Pending Prescriptions Disp Refills   propranolol (INDERAL) 80 MG tablet [Pharmacy Med Name: Propranolol HCl 80 MG Oral Tablet] 90 tablet 1    Sig: Take 1 tablet by mouth once daily     Cardiovascular:  Beta Blockers Passed - 01/16/2023 12:59 AM      Passed - Last BP in normal range    BP Readings from Last 1 Encounters:  01/11/23 100/64         Passed - Last Heart Rate in normal range    Pulse Readings from Last 1 Encounters:  01/11/23 (!) 59         Passed - Valid encounter within last 6 months    Recent Outpatient Visits           3 weeks ago Rosacea   [facility] Berniece Salines, FNP   2 months ago Annual physical exam   [facility] Berniece Salines, FNP   1 year ago Annual physical exam   [facility] Berniece Salines, FNP   1 year ago Skin lesion   [facility] Berniece Salines, FNP       Future Appointments             In 4 months Terri Piedra, DO [facility] Dermatology   In 9 months Zane Herald, Rudolpho Sevin, FNP [facility]   In 11 months Willeen Niece, MD [facility]

## 2023-01-23 ENCOUNTER — Encounter: Payer: Self-pay | Admitting: Nurse Practitioner

## 2023-01-26 ENCOUNTER — Telehealth: Payer: Self-pay

## 2023-01-26 NOTE — Telephone Encounter (Signed)
Patient left a  voicemail at 2:30pm and states PCP sent a referral on 12/27/2022 and  she has not heard anything about scheduling a referral from our office. She states that she reached out to her PCP on mychart and she gave her our number to call us to find out what was going on with the referral. Returned patient call and left a message for call back

## 2023-01-28 ENCOUNTER — Other Ambulatory Visit: Payer: Self-pay | Admitting: Obstetrics and Gynecology

## 2023-01-28 DIAGNOSIS — F419 Anxiety disorder, unspecified: Secondary | ICD-10-CM

## 2023-01-31 ENCOUNTER — Encounter: Payer: Self-pay | Admitting: Nurse Practitioner

## 2023-01-31 ENCOUNTER — Encounter: Payer: Self-pay | Admitting: Obstetrics and Gynecology

## 2023-01-31 ENCOUNTER — Other Ambulatory Visit: Payer: Self-pay | Admitting: Nurse Practitioner

## 2023-01-31 DIAGNOSIS — F419 Anxiety disorder, unspecified: Secondary | ICD-10-CM

## 2023-01-31 MED ORDER — BUSPIRONE HCL 7.5 MG PO TABS: 7.5000 mg | ORAL_TABLET | Freq: Three times a day (TID) | ORAL | 2 refills | Status: AC

## 2023-02-08 ENCOUNTER — Ambulatory Visit: Payer: Medicaid Other | Admitting: Podiatry

## 2023-02-08 ENCOUNTER — Encounter: Payer: Self-pay | Admitting: Podiatry

## 2023-02-08 DIAGNOSIS — M62469 Contracture of muscle, unspecified lower leg: Secondary | ICD-10-CM | POA: Diagnosis not present

## 2023-02-08 DIAGNOSIS — M722 Plantar fascial fibromatosis: Secondary | ICD-10-CM

## 2023-02-08 NOTE — Progress Notes (Signed)
Subjective:  Patient ID: Candace Vaughn, female    DOB: 1980/08/13,  MRN: [id number]  Chief Complaint  Patient presents with   Plantar Fasciitis    "They're still about the same."    42 y.o. female presents with the above complaint.  Patient presents with complaint of bilateral plantar fasciitis pain she states is doing better.  The injection helped.  She would like to discuss next treatment plan she denies any other acute complaints.   Review of Systems: Negative except as noted in the HPI. Denies N/V/F/Ch.  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   Dysmenorrhea    mild   Esophageal reflux    H/O: hysterectomy 06/13/2014   CP JR; TLH/BS   History of abnormal cervical Pap smear 12/02/2014   ASCUS/neg-needs yearly paps for several yrs due to AIS   Hyperlipidemia    started on lipitor 3/14;recheck in 5/14   Postpartum depression    Thyromegaly 2016   no nodules, normal thryroid labs    Current Outpatient Medications:    atorvastatin (LIPITOR) 20 MG tablet, Take 1 tablet (20 mg total) by mouth daily., Disp: 90 tablet, Rfl: 3   busPIRone (BUSPAR) 7.5 MG tablet, Take 1 tablet (7.5 mg total) by mouth 3 (three) times daily., Disp: 270 tablet, Rfl: 2   omeprazole (PRILOSEC) 20 MG capsule, Take 1 capsule by mouth once daily, Disp: 90 capsule, Rfl: 0   propranolol (INDERAL) 80 MG tablet, Take 1 tablet by mouth once daily, Disp: 90 tablet, Rfl: 1   SUMAtriptan (IMITREX) 25 MG tablet, TAKE 2 TABLETS BY MOUTH EVERY 2 HOURS AS NEEDED FOR  MIGRAINE (ONGOING HEADACHE). MAX DAILY DOSE 200MG , Disp: 6 tablet, Rfl: 0   clotrimazole-betamethasone (LOTRISONE) cream, Apply externally BID prn sx up to 2 wks (Patient not taking: Reported on 12/22/2022), Disp: 15 g, Rfl: 0  Social  History   Tobacco Use  Smoking Status Never  Smokeless Tobacco Never    No Known Allergies Objective:   There were no vitals filed for this visit.  There is no height or weight on file to calculate BMI. Constitutional Well developed. Well nourished.  Vascular Dorsalis pedis pulses palpable bilaterally. Posterior tibial pulses palpable bilaterally. Capillary refill normal to all digits.  No cyanosis or clubbing noted. Pedal hair growth normal.  Neurologic Normal speech. Oriented to person, place, and time. Epicritic sensation to light touch grossly present bilaterally.  Dermatologic Nails well groomed and normal in appearance. No open wounds. No skin lesions.  Orthopedic: Normal joint ROM without pain or crepitus bilaterally. No visible deformities. Tender to palpation at the calcaneal tuber bilaterally. No pain with calcaneal squeeze bilaterally. Ankle ROM diminished range of motion bilaterally. Silfverskiold Test: positive bilaterally.   Radiographs: None  Assessment:   1. Plantar fasciitis of right foot   2. Plantar fasciitis of left foot   3. Gastrocnemius equinus, unspecified laterality    Plan:  Patient was evaluated and treated and all questions answered.  Plantar Fasciitis, bilaterally with underlying gastrocnemius equinus - XR reviewed as above.  - Educated on icing and stretching. Instructions given.  -Second injection delivered to the plantar fascia as below. - DME: Plantar fascial brace dispensed to support the medial longitudinal arch of the foot and  offload pressure from the heel and prevent arch collapse during weightbearing - Pharmacologic management: None  Procedure: Injection Tendon/Ligament Location: Bilateral plantar fascia at the glabrous junction; medial approach. Skin Prep: alcohol Injectate: 0.5 cc 0.5% marcaine plain, 0.5 cc of 1% Lidocaine, 0.5 cc kenalog 10. Disposition: Patient tolerated procedure well. Injection site dressed with a  band-aid.  No follow-ups on file.

## 2023-02-13 DIAGNOSIS — Z419 Encounter for procedure for purposes other than remedying health state, unspecified: Secondary | ICD-10-CM | POA: Diagnosis not present

## 2023-02-27 ENCOUNTER — Encounter: Payer: Self-pay | Admitting: Nurse Practitioner

## 2023-02-28 NOTE — Progress Notes (Signed)
BP 106/70 (BP Location: Left Arm, Patient Position: Sitting, Cuff Size: Large)   Pulse 72   Temp 97.7 F (36.5 C) (Oral)   Resp 16   Ht 5\' 3"  (1.6 m) Comment: per patient  Wt 196 lb 3.2 oz (89 kg)   SpO2 99%   BMI 34.76 kg/m    Subjective:    Patient ID: Candace Vaughn, female    DOB: 07/14/1980, 42 y.o.   MRN: [id number]  HPI: Candace Vaughn is a 42 y.o. female  Chief Complaint  Patient presents with   Rash    Inner thighs, knees, elbows x4 days    Discussed the use of AI scribe software for clinical note transcription with the patient, who gave verbal consent to proceed.  History of Present Illness   The patient, with no known allergies, presents with a pruritic rash that began on the inner thighs and has since spread to the knees and elbows. The rash began suddenly after changing into sweatpants after work, initially presenting as itching, and by the next morning had developed into a rash with welts. The patient attempted to manage the symptoms with hydrocortisone cream and oral Benadryl, but found no relief and was unable to sleep due to the itching. The patient denies any changes in soaps or other potential allergens, and denies exposure to poison ivy or similar irritants. The patient also reports that the rash was so itchy that they scratched it to the point of feeling bruised.       12/22/2022    8:11 AM 11/05/2022    9:09 AM 11/03/2021    3:12 PM  Depression screen PHQ 2/9  Decreased Interest 0 0 0  Down, Depressed, Hopeless 1 0 1  PHQ - 2 Score 1 0 1  Altered sleeping  2 1  Tired, decreased energy  0 1  Change in appetite  0 0  Feeling bad or failure about yourself   0 0  Trouble concentrating  0 1  Moving slowly or fidgety/restless  0 1  Suicidal thoughts  0 0  PHQ-9 Score  2 5  Difficult doing work/chores  Not difficult at all Not difficult at all    Relevant past medical, surgical, family and social history reviewed and updated as indicated.  Interim medical history since our last visit reviewed. Allergies and medications reviewed and updated.  Review of Systems  Ten systems reviewed and is negative except as mentioned in HPI      Objective:    BP 106/70 (BP Location: Left Arm, Patient Position: Sitting, Cuff Size: Large)   Pulse 72   Temp 97.7 F (36.5 C) (Oral)   Resp 16   Ht 5\' 3"  (1.6 m) Comment: per patient  Wt 196 lb 3.2 oz (89 kg)   SpO2 99%   BMI 34.76 kg/m    Wt Readings from Last 3 Encounters:  03/01/23 196 lb 3.2 oz (89 kg)  12/22/22 198 lb 8 oz (90 kg)  12/09/22 197 lb (89.4 kg)    Physical Exam  Constitutional: Patient appears well-developed and well-nourished. Obese  No distress.  HEENT: head atraumatic, normocephalic, pupils equal and reactive to light, neck supple Cardiovascular: Normal rate, regular rhythm and normal heart sounds.  No murmur heard. No BLE edema. Pulmonary/Chest: Effort normal and breath sounds normal. No respiratory distress. Abdominal: Soft.  There is no tenderness. Skin :  hives noted on bilateral thighs Psychiatric: Patient has a normal mood and affect. behavior is normal.  Judgment and thought content normal.  Results for orders placed or performed in visit on 12/09/22  Cytology - PAP   Collection Time: 12/09/22  9:48 AM  Result Value Ref Range   High risk HPV Negative    Adequacy      Satisfactory for evaluation; transformation zone component ABSENT.   Diagnosis      - Negative for Intraepithelial Lesions or Malignancy (NILM)   Diagnosis - Benign reactive/reparative changes    Comment Normal Reference Range HPV - Negative        Assessment & Plan:   Problem List Items Addressed This Visit   None Visit Diagnoses       Allergic dermatitis    -  Primary   Relevant Medications   predniSONE (STERAPRED UNI-PAK 21 TAB) 10 MG (21) TBPK tablet   triamcinolone acetonide (KENALOG-40) injection 40 mg (Completed)   hydrOXYzine (VISTARIL) 25 MG capsule         Assessment and Plan    Allergic Dermatitis Acute onset of pruritic, weeping rash on legs, knees, and elbows. No known exposure to new allergens or irritants. No relief with over-the-counter hydrocortisone cream and Benadryl. -Administer Kenalog 40mg  IM today. -Start oral steroid taper tomorrow. -start hydroxyzine at bedtime for itching -Consider Zyrtec or Claritin for less sedating antihistamine option. -Add Pepcid twice daily  -If no improvement, consider referral to allergist for further evaluation. -Follow-up message on 03/03/2023 to assess response to treatment.        Follow up plan: Return if symptoms worsen or fail to improve.

## 2023-03-01 ENCOUNTER — Encounter: Payer: Self-pay | Admitting: Nurse Practitioner

## 2023-03-01 ENCOUNTER — Ambulatory Visit: Payer: Medicaid Other | Admitting: Nurse Practitioner

## 2023-03-01 ENCOUNTER — Other Ambulatory Visit: Payer: Self-pay | Admitting: Nurse Practitioner

## 2023-03-01 VITALS — BP 106/70 | HR 72 | Temp 97.7°F | Resp 16 | Ht 63.0 in | Wt 196.2 lb

## 2023-03-01 DIAGNOSIS — L282 Other prurigo: Secondary | ICD-10-CM

## 2023-03-01 DIAGNOSIS — L239 Allergic contact dermatitis, unspecified cause: Secondary | ICD-10-CM | POA: Diagnosis not present

## 2023-03-01 DIAGNOSIS — E782 Mixed hyperlipidemia: Secondary | ICD-10-CM

## 2023-03-01 MED ORDER — TRIAMCINOLONE ACETONIDE 40 MG/ML IJ SUSP
40.0000 mg | Freq: Once | INTRAMUSCULAR | Status: AC
Start: 2023-03-01 — End: 2023-03-01
  Administered 2023-03-01: 40 mg via INTRAMUSCULAR

## 2023-03-01 MED ORDER — HYDROXYZINE PAMOATE 25 MG PO CAPS: 25.0000 mg | ORAL_CAPSULE | Freq: Every evening | ORAL | 0 refills | Status: DC | PRN

## 2023-03-01 MED ORDER — PREDNISONE 10 MG (21) PO TBPK: ORAL_TABLET | ORAL | 0 refills | Status: DC

## 2023-03-01 NOTE — Patient Instructions (Signed)
Steroid taper Hydroxyzine at bedtime Zyrtec/claritin in the morning Pepcid 2 times a day  Apply ice to areas Can continue aloe

## 2023-03-02 NOTE — Telephone Encounter (Signed)
Requested Prescriptions  Pending Prescriptions Disp Refills   atorvastatin (LIPITOR) 20 MG tablet [Pharmacy Med Name: Atorvastatin Calcium 20 MG Oral Tablet] 90 tablet 2    Sig: Take 1 tablet by mouth once daily     Cardiovascular:  Antilipid - Statins Failed - 03/02/2023  9:38 AM      Failed - Lipid Panel in normal range within the last 12 months    Cholesterol, Total  Date Value Ref Range Status  11/24/2020 159 100 - 199 mg/dL Final   Cholesterol  Date Value Ref Range Status  11/05/2022 146 <200 mg/dL Final   LDL Cholesterol (Calc)  Date Value Ref Range Status  11/05/2022 76 mg/dL (calc) Final    Comment:    Reference range: <100 . Desirable range <100 mg/dL for primary prevention;   <70 mg/dL for patients with CHD or diabetic patients  with > or = 2 CHD risk factors. Marland Kitchen LDL-C is now calculated using the Martin-Hopkins  calculation, which is a validated novel method providing  better accuracy than the Friedewald equation in the  estimation of LDL-C.  Horald Pollen et al. Lenox Ahr. 8119;147(82): 2061-2068  (http://education.[employer].com/faq/FAQ164)    HDL  Date Value Ref Range Status  11/05/2022 51 > OR = 50 mg/dL Final  95/62/1308 53 >65 mg/dL Final   Triglycerides  Date Value Ref Range Status  11/05/2022 104 <150 mg/dL Final         Passed - Patient is not pregnant      Passed - Valid encounter within last 12 months    Recent Outpatient Visits           Yesterday Allergic dermatitis   [facility] Berniece Salines, FNP   2 months ago Rosacea   [facility] Berniece Salines, FNP   3 months ago Annual physical exam   [facility] Berniece Salines, FNP   1 year ago Annual physical exam   [facility] Berniece Salines, FNP   1 year ago Skin lesion   [facility] Berniece Salines, FNP       Future Appointments             In 3  months Terri Piedra, DO [facility] Dermatology   In 8 months Zane Herald, Rudolpho Sevin, FNP [facility]   In 10 months Willeen Niece, MD [facility]

## 2023-03-03 ENCOUNTER — Encounter: Payer: Self-pay | Admitting: Nurse Practitioner

## 2023-03-16 DIAGNOSIS — Z419 Encounter for procedure for purposes other than remedying health state, unspecified: Secondary | ICD-10-CM | POA: Diagnosis not present

## 2023-03-16 NOTE — Progress Notes (Signed)
 Ellouise Console, PA-C [address]  [address]  [place], [place] [address]  Main: [phone]  Fax: [phone]   Gastroenterology Consultation  Referring Provider:     Gareth Mliss FALCON, FNP Primary Care Physician:  Gareth Mliss FALCON, FNP Primary Gastroenterologist:  Ellouise Console, PA-C  Reason for Consultation:     Cough        HPI:   Candace Vaughn is a 43 y.o. y/o female referred for consultation & management  by Gareth Mliss FALCON, FNP.    Here to evaluate chronic cough for 1 year.  Patient states she has mild dry cough and throat clearing every time she eats.  Has history of thyroid  Gorder ureter which has been evaluated with thyroid  ultrasound.  She denies solid food dysphagia, chest pain, abdominal pain or heartburn.  She does not feel a lot of acid in her throat.  She just started taking omeprazole  20 Mg once a daily with little benefit.  Has not been on it long.  Before that she tried OTC Pepcid .  She denies shortness of breath, chest pain, history of asthma, postnasal drainage, or itchy eyes.  She has recently been treated for hives and rash with prednisone .    No previous GI, allergist, or pulmonary evaluation.  She denies family history of GI malignancies.  Her mother had colon polyps.  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 Authorizing Provider  atorvastatin  (LIPITOR) 20 MG tablet Take 1 tablet by mouth once daily 03/02/23   Pender, Julie F, FNP  busPIRone  (BUSPAR ) 7.5 MG tablet Take 1 tablet (7.5 mg total) by mouth 3 (three) times daily. 01/31/23   Pender, Julie F, FNP  hydrOXYzine  (VISTARIL ) 25 MG capsule Take 1 capsule (25 mg total) by mouth at bedtime as needed for itching. 03/01/23   Gareth Mliss FALCON, FNP  omeprazole  (PRILOSEC) 20 MG capsule Take 1 capsule by mouth once daily 01/17/23   Pender, Julie F, FNP  predniSONE  (STERAPRED UNI-PAK 21 TAB) 10 MG (21) TBPK tablet Take as directed on package.  (60 mg po on day 1, 50 mg po on day 2...) 03/01/23   Pender, Julie F, FNP  propranolol  (INDERAL ) 80 MG tablet Take 1 tablet by mouth once daily 01/17/23   Pender, Julie F, FNP  SUMAtriptan  (IMITREX ) 25 MG tablet TAKE 2 TABLETS BY MOUTH EVERY 2 HOURS AS NEEDED FOR  MIGRAINE (ONGOING HEADACHE). MAX DAILY DOSE 200MG  12/16/22   Gareth Mliss FALCON, FNP    Family History  Problem Relation Age of Onset   Hyperlipidemia Mother  Breast cancer Mother 26   Osteopenia Mother    Heart attack Mother        33s   Colon polyps Mother    COPD Father    Hypertension Father    Hyperlipidemia Sister 30   Dementia Maternal Grandmother        senile   Hyperlipidemia Maternal Grandmother    Leukemia Son        4 months   Breast cancer Maternal Aunt 42       has contact with her     Social History   Tobacco Use   Smoking status: Never   Smokeless tobacco: Never  Vaping Use   Vaping status: Never Used  Substance Use Topics   Alcohol use: Not Currently    Comment: occasionally   Drug use: Never    Allergies as of 03/17/2023   (No Known Allergies)    Review of Systems:    All systems reviewed and negative except where noted in HPI.   Physical Exam:  BP  126/72   Pulse 66   Temp 98.2 F (36.8 C)   Ht 5' 3 (1.6 m)   Wt 201 lb 9.6 oz (91.4 kg)   BMI 35.71 kg/m  No LMP recorded. Patient has had a hysterectomy.  General:   Alert,  Well-developed, well-nourished, pleasant and cooperative in NAD Lungs:  Respirations even and unlabored.  Clear throughout to auscultation.   No wheezes, crackles, or rhonchi. No acute distress. Heart:  Regular rate and rhythm; no murmurs, clicks, rubs, or gallops. Abdomen:  Normal bowel sounds.  No bruits.  Soft, and non-distended without masses, hepatosplenomegaly or hernias noted.  No Tenderness.  No guarding or rebound tenderness.    Neurologic:  Alert and oriented x3;  grossly normal neurologically. Psych:  Alert and cooperative. Normal mood and affect.  Imaging Studies: No results found.  Assessment and Plan:   Candace Vaughn is a 43 y.o. 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   Thyroid  Goiter 4.   Hives  Chronic cough could be due to to silent acid reflux with laryngeal pharyngeal reflux disease.  However chronic cough may also be due to allergies, asthma, or other etiology.  She has been taking a low-dose omeprazole  20 Mg once daily for a few weeks.  I recommend increase PPI dose and give it more time to treat possible silent acid reflux.  We discussed treatment for GERD at length.  Plan: Increase Omeprazole  to 40mg  1 tablet daily; Take 30 minutes before a meal. Start Famotidine  20mg  Twice daily. Recommend Lifestyle Modifications to prevent Acid Reflux.  Rec. Avoid coffee, sodas, peppermint, garlic, onions, alcohol, citrus fruits, chocolate, tomatoes, fatty and spicey foods.  Avoid eating 2-3 hours before bedtime.   Recommend Chest Xray and Allergy Evaluation if Cough and Hives Persist. Continue f/u with PCP and possible Endocrinologist for Thyroid  Goiter.  Follow up 6 weeks to assess response to treatment.  Ellouise Console, PA-C

## 2023-03-17 ENCOUNTER — Encounter: Payer: Self-pay | Admitting: Physician Assistant

## 2023-03-17 ENCOUNTER — Ambulatory Visit: Payer: Medicaid Other | Admitting: Physician Assistant

## 2023-03-17 VITALS — BP 126/72 | HR 66 | Temp 98.2°F | Ht 63.0 in | Wt 201.6 lb

## 2023-03-17 DIAGNOSIS — L509 Urticaria, unspecified: Secondary | ICD-10-CM

## 2023-03-17 DIAGNOSIS — E049 Nontoxic goiter, unspecified: Secondary | ICD-10-CM | POA: Diagnosis not present

## 2023-03-17 DIAGNOSIS — R053 Chronic cough: Secondary | ICD-10-CM

## 2023-03-17 DIAGNOSIS — K219 Gastro-esophageal reflux disease without esophagitis: Secondary | ICD-10-CM

## 2023-03-17 MED ORDER — OMEPRAZOLE 40 MG PO CPDR
40.0000 mg | DELAYED_RELEASE_CAPSULE | Freq: Every day | ORAL | 2 refills | Status: DC
Start: 2023-03-17 — End: 2023-06-15

## 2023-03-17 MED ORDER — FAMOTIDINE 20 MG PO TABS
20.0000 mg | ORAL_TABLET | Freq: Two times a day (BID) | ORAL | 2 refills | Status: DC
Start: 2023-03-17 — End: 2023-11-24

## 2023-03-25 ENCOUNTER — Other Ambulatory Visit: Payer: Self-pay | Admitting: Nurse Practitioner

## 2023-03-25 DIAGNOSIS — L239 Allergic contact dermatitis, unspecified cause: Secondary | ICD-10-CM

## 2023-03-28 NOTE — Telephone Encounter (Signed)
 Requested Prescriptions  Pending Prescriptions Disp Refills   hydrOXYzine  (VISTARIL ) 25 MG capsule [Pharmacy Med Name: hydrOXYzine  Pamoate 25 MG Oral Capsule] 30 capsule 0    Sig: TAKE 1 CAPSULE BY MOUTH AT BEDTIME AS NEEDED FOR ITCHING     Ear, Nose, and Throat:  Antihistamines 2 Passed - 03/28/2023  2:25 PM      Passed - Cr in normal range and within 360 days    Creat  Date Value Ref Range Status  11/05/2022 0.79 0.50 - 0.97 mg/dL Final         Passed - Valid encounter within last 12 months    Recent Outpatient Visits           3 weeks ago Allergic dermatitis   [facility] Gareth Mliss FALCON, FNP   3 months ago Rosacea   [facility] Gareth Mliss FALCON, FNP   4 months ago Annual physical exam   [facility] Gareth Mliss FALCON, FNP   1 year ago Annual physical exam   [facility] Gareth Mliss FALCON, FNP   1 year ago Skin lesion   [facility] Gareth Mliss FALCON, FNP       Future Appointments             In 2 months Alm Delon SAILOR, DO [facility] Dermatology   In 7 months Gareth, Mliss FALCON, FNP [facility], [facility]   In 9 months Jackquline Sawyer, MD [facility]

## 2023-03-31 ENCOUNTER — Other Ambulatory Visit: Payer: Self-pay | Admitting: Nurse Practitioner

## 2023-03-31 DIAGNOSIS — L239 Allergic contact dermatitis, unspecified cause: Secondary | ICD-10-CM

## 2023-03-31 NOTE — Telephone Encounter (Signed)
Duplicate request.  Requested Prescriptions  Pending Prescriptions Disp Refills   hydrOXYzine (VISTARIL) 25 MG capsule [Pharmacy Med Name: hydrOXYzine Pamoate 25 MG Oral Capsule] 30 capsule 0    Sig: TAKE 1 CAPSULE BY MOUTH AT BEDTIME AS NEEDED FOR ITCHING     Ear, Nose, and Throat:  Antihistamines 2 Passed - 03/31/2023 11:28 AM      Passed - Cr in normal range and within 360 days    Creat  Date Value Ref Range Status  11/05/2022 0.79 0.50 - 0.97 mg/dL Final         Passed - Valid encounter within last 12 months    Recent Outpatient Visits           1 month ago Allergic dermatitis   [facility] Berniece Salines, FNP   3 months ago Rosacea   [facility] Berniece Salines, FNP   4 months ago Annual physical exam   [facility] Berniece Salines, FNP   1 year ago Annual physical exam   [facility] Berniece Salines, FNP   1 year ago Skin lesion   [facility] Berniece Salines, FNP       Future Appointments             In 2 months Terri Piedra, DO [facility] Dermatology   In 7 months Zane Herald, Rudolpho Sevin, FNP [facility], [facility]   In 9 months Willeen Niece, MD [facility] [place]

## 2023-04-14 ENCOUNTER — Encounter: Payer: Self-pay | Admitting: Nurse Practitioner

## 2023-04-16 DIAGNOSIS — Z419 Encounter for procedure for purposes other than remedying health state, unspecified: Secondary | ICD-10-CM | POA: Diagnosis not present

## 2023-04-27 NOTE — Progress Notes (Unsigned)
Celso Amy, PA-C [address]  [address]  [place], [place] [address]  Main: [phone]  Fax: [phone]   Primary Care Physician: Berniece Salines, FNP  Primary Gastroenterologist:  Celso Amy, PA-C  CC:  F/U chronic cough, GERD  HPI: Candace Vaughn is a 43 y.o. female returns for 6-week follow-up of chronic cough ongoing for 1 year.  Also had throat clearing after eating.  1 month ago omeprazole was increased from 20 to 40 mg daily to treat possible laryngeal pharyngeal reflux disease.  She was also started on famotidine 20 Mg twice daily.  Advised to avoid GERD trigger foods and drinks.  She has been consistent with taking treatment daily.  She is still having dry cough which is worse after eating.  She reports throat clearing.  Symptoms are the same.  Cough is dry nonproductive.  She does admit to occasionally choking on food and liquids.  Also admits to mild shortness of breath.  She has seen her PCP for thyroid goiter.  02/2023 she was treated with steroids for rash, allergic dermatitis.  Also given hydroxyzine.  These medicines did not help her cough.  Current Outpatient Medications  Medication Sig Dispense Refill   atorvastatin (LIPITOR) 20 MG tablet Take 1 tablet by mouth once daily 90 tablet 2   busPIRone (BUSPAR) 7.5 MG tablet Take 1 tablet (7.5 mg total) by mouth 3 (three) times daily. 270 tablet 2   famotidine (PEPCID) 20 MG tablet Take 1 tablet (20 mg total) by mouth 2 (two) times daily. 60 tablet 2   hydrOXYzine (VISTARIL) 25 MG capsule TAKE 1 CAPSULE BY MOUTH AT BEDTIME AS NEEDED FOR ITCHING 30 capsule 0   omeprazole (PRILOSEC) 40 MG capsule Take 1 capsule (40 mg total) by mouth daily. 30 capsule 2   predniSONE (STERAPRED UNI-PAK 21 TAB) 10 MG (21) TBPK tablet Take as directed on package.  (60 mg po on day 1, 50 mg po on day 2...) 21 tablet 0   propranolol (INDERAL) 80 MG tablet Take 1 tablet by mouth once daily 90 tablet 1   SUMAtriptan (IMITREX) 25  MG tablet TAKE 2 TABLETS BY MOUTH EVERY 2 HOURS AS NEEDED FOR  MIGRAINE (ONGOING HEADACHE). MAX DAILY DOSE 200MG  6 tablet 0   No current facility-administered medications for this visit.    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   Physical Examination:   BP 105/70   Pulse 72   Temp 97.9 F (36.6  C)   Ht 5\' 3"  (1.6 m)   Wt 203 lb 9.6 oz (92.4 kg)   BMI 36.07 kg/m   General: Well-nourished, well-developed in no acute distress.  Neck: Enlarged thyroid; no palpable masses. Lungs: Clear to auscultation bilaterally. Non-labored. Heart: Regular rate and rhythm, no murmurs rubs or gallops.  Abdomen: Bowel sounds are normal; Abdomen is Soft; No hepatosplenomegaly, masses or hernias;  No Abdominal Tenderness; No guarding or rebound tenderness. Neuro: Alert and oriented x 3.  Grossly intact.  Psych: Alert and cooperative, normal mood and affect.   Imaging Studies: No results found.  Assessment and Plan:   Candace Vaughn is a 43 y.o. y/o female presents for f/u of:  1.  Chronic cough for 1 year  Chest x-ray  2.   Laryngeal pharyngeal reflux disease  Continue omeprazole 40 Mg daily  Continue famotidine 20 Mg twice daily  Continue GERD diet  3.  Mild Dysphagia  Barium Swallow with Tablet; Evaluate for Hiatal hernia, stricture, dysmotility, GERD.  Pending barium swallow results, consider EGD.  3.  Thyroid goiter  Follow-up with PCP and endocrinologist   Celso Amy, PA-C  Follow up 4 weeks with TG.

## 2023-04-28 ENCOUNTER — Encounter: Payer: Self-pay | Admitting: Physician Assistant

## 2023-04-28 ENCOUNTER — Ambulatory Visit: Payer: Medicaid Other | Admitting: Physician Assistant

## 2023-04-28 ENCOUNTER — Ambulatory Visit
Admission: RE | Admit: 2023-04-28 | Discharge: 2023-04-28 | Disposition: A | Discharge status: Home / Self Care | Payer: Medicaid Other | Source: Ambulatory Visit | Attending: Physician Assistant | Admitting: Physician Assistant

## 2023-04-28 ENCOUNTER — Ambulatory Visit
Admission: RE | Admit: 2023-04-28 | Discharge: 2023-04-28 | Disposition: A | Discharge status: Home / Self Care | Payer: Medicaid Other | Attending: Physician Assistant | Admitting: Physician Assistant

## 2023-04-28 VITALS — BP 105/70 | HR 72 | Temp 97.9°F | Ht 63.0 in | Wt 203.6 lb

## 2023-04-28 DIAGNOSIS — K219 Gastro-esophageal reflux disease without esophagitis: Secondary | ICD-10-CM

## 2023-04-28 DIAGNOSIS — R059 Cough, unspecified: Secondary | ICD-10-CM | POA: Insufficient documentation

## 2023-04-28 DIAGNOSIS — E049 Nontoxic goiter, unspecified: Secondary | ICD-10-CM | POA: Diagnosis not present

## 2023-04-28 DIAGNOSIS — R053 Chronic cough: Secondary | ICD-10-CM

## 2023-04-28 DIAGNOSIS — R0602 Shortness of breath: Secondary | ICD-10-CM

## 2023-04-28 DIAGNOSIS — R1310 Dysphagia, unspecified: Secondary | ICD-10-CM

## 2023-04-28 DIAGNOSIS — R0989 Other specified symptoms and signs involving the circulatory and respiratory systems: Secondary | ICD-10-CM | POA: Diagnosis not present

## 2023-04-28 NOTE — Patient Instructions (Addendum)
Barium swallow with tablet scheduled - [facility] -arrive at 9:30 am on  05/04/23 . Nothing to eat / drink 4 hours prior..  You may go directly to Outpatient Imaging for the chest xray. [id number] [place]

## 2023-04-29 ENCOUNTER — Other Ambulatory Visit: Payer: Self-pay | Admitting: Nurse Practitioner

## 2023-04-29 DIAGNOSIS — L239 Allergic contact dermatitis, unspecified cause: Secondary | ICD-10-CM

## 2023-05-02 NOTE — Telephone Encounter (Signed)
Requested Prescriptions  Pending Prescriptions Disp Refills   hydrOXYzine (VISTARIL) 25 MG capsule [Pharmacy Med Name: hydrOXYzine Pamoate 25 MG Oral Capsule] 30 capsule 0    Sig: TAKE 1 CAPSULE BY MOUTH AT BEDTIME AS NEEDED FOR ITCHING     Ear, Nose, and Throat:  Antihistamines 2 Passed - 05/02/2023  8:00 AM      Passed - Cr in normal range and within 360 days    Creat  Date Value Ref Range Status  11/05/2022 0.79 0.50 - 0.97 mg/dL Final         Passed - Valid encounter within last 12 months    Recent Outpatient Visits           2 months ago Allergic dermatitis   [facility] Berniece Salines, FNP   4 months ago Rosacea   [facility] Berniece Salines, FNP   5 months ago Annual physical exam   [facility] Berniece Salines, FNP   1 year ago Annual physical exam   [facility] Berniece Salines, FNP   1 year ago Skin lesion   [facility] Berniece Salines, FNP       Future Appointments             In 1 month Terri Piedra, DO [facility] Dermatology   In 6 months Zane Herald, Rudolpho Sevin, FNP [facility], [facility]   In 8 months Willeen Niece, MD [facility]

## 2023-05-04 ENCOUNTER — Ambulatory Visit
Admission: RE | Admit: 2023-05-04 | Discharge: 2023-05-04 | Disposition: A | Discharge status: Home / Self Care | Payer: Medicaid Other | Source: Ambulatory Visit | Attending: Physician Assistant | Admitting: Physician Assistant

## 2023-05-04 ENCOUNTER — Encounter: Payer: Self-pay | Admitting: Physician Assistant

## 2023-05-04 ENCOUNTER — Other Ambulatory Visit: Payer: Self-pay | Admitting: Physician Assistant

## 2023-05-04 DIAGNOSIS — R0602 Shortness of breath: Secondary | ICD-10-CM

## 2023-05-04 DIAGNOSIS — R1310 Dysphagia, unspecified: Secondary | ICD-10-CM | POA: Insufficient documentation

## 2023-05-04 DIAGNOSIS — R053 Chronic cough: Secondary | ICD-10-CM | POA: Diagnosis not present

## 2023-05-04 DIAGNOSIS — E049 Nontoxic goiter, unspecified: Secondary | ICD-10-CM

## 2023-05-04 DIAGNOSIS — K219 Gastro-esophageal reflux disease without esophagitis: Secondary | ICD-10-CM

## 2023-05-04 DIAGNOSIS — R059 Cough, unspecified: Secondary | ICD-10-CM

## 2023-05-14 DIAGNOSIS — Z419 Encounter for procedure for purposes other than remedying health state, unspecified: Secondary | ICD-10-CM | POA: Diagnosis not present

## 2023-05-25 NOTE — Progress Notes (Unsigned)
 Celso Amy, PA-C [address]  [address]  [place], [place] [address]  Main: [phone]  Fax: [phone]   Primary Care Physician: Berniece Salines, FNP  Primary Gastroenterologist:  Celso Amy, PA-C   CC: Follow-up chronic cough  HPI: Candace Vaughn is a 43 y.o. female returns for follow-up of chronic cough ongoing for over 1 year.  Also had throat clearing after eating.  Symptoms thought possibly due to acid reflux disease.  Currently taking omeprazole 40 Mg once daily and famotidine 20 Mg twice daily.  These medicines have not helped her cough.  She still has mild occasional cough when she swallows or eats.  She is not having difficulty swallowing liquids or solid foods.  Denies dysphagia or aspiration.  She denies acid reflux, heartburn, or nocturnal acid reflux symptoms.  She denies any allergy symptoms such as itchy watery eyes, runny nose, sneezing, or postnasal drainage.  04/28/2023 chest x-ray (to evaluate cough and shortness of breath): Normal chest x-ray.  05/04/2023 barium swallow with tablet (to evaluate mild dysphagia): Normal.  No aspiration, hiatal hernia, GERD, or other abnormality.  Barium tablet passed normally.   She has seen her PCP for thyromegaly.  Thyroid ultrasound 12/2022 showed no thyroid nodules.  Mildly enlarged thyroid.  Normal TSH.   02/2023 she took steroids and hydroxyzine to treat allergic dermatitis.  These medications did not help the cough.  Current Outpatient Medications  Medication Sig Dispense Refill   atorvastatin (LIPITOR) 20 MG tablet Take 1 tablet by mouth once daily 90 tablet 2   busPIRone (BUSPAR) 7.5 MG tablet Take 1 tablet (7.5 mg total) by mouth 3 (three) times daily. 270 tablet 2   famotidine (PEPCID) 20 MG tablet Take 1 tablet (20 mg total) by mouth 2 (two) times daily. 60 tablet 2   hydrOXYzine (VISTARIL) 25 MG capsule TAKE 1 CAPSULE BY MOUTH AT BEDTIME AS NEEDED FOR ITCHING 30 capsule 0   omeprazole (PRILOSEC) 40 MG  capsule Take 1 capsule (40 mg total) by mouth daily. 30 capsule 2   predniSONE (STERAPRED UNI-PAK 21 TAB) 10 MG (21) TBPK tablet Take as directed on package.  (60 mg po on day 1, 50 mg po on day 2...) 21 tablet 0   propranolol (INDERAL) 80 MG tablet Take 1 tablet by mouth once daily 90 tablet 1   SUMAtriptan (IMITREX) 25 MG tablet TAKE 2 TABLETS BY MOUTH EVERY 2 HOURS AS NEEDED FOR  MIGRAINE (ONGOING HEADACHE). MAX DAILY DOSE 200MG  6 tablet 0   No current facility-administered medications for this visit.    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   Physical Examination:   BP 107/74   Pulse 76   Temp 97.9 F (36.6 C) (Oral)   Ht 5\' 3"  (1.6 m)   Wt 202 lb (91.6 kg)   BMI 35.78 kg/m   General: Well-nourished, well-developed in no acute distress.  Neuro: Alert and oriented x 3.  Grossly intact.  Psych: Alert and cooperative, normal mood and affect.  Imaging Studies: DG ESOPHAGUS W DOUBLE CM (HD) Result Date: 05/04/2023 CLINICAL DATA:  Chronic cough with occasional choking sensation after eating for the past 2 years. No  improvement after starting reflux medication. EXAM: ESOPHOGRAM / BARIUM SWALLOW / BARIUM TABLET STUDY TECHNIQUE: Combined double contrast and single contrast examination performed using effervescent crystals, thick barium liquid, and thin barium liquid. The patient was observed with fluoroscopy swallowing a 13 mm barium sulphate tablet. FLUOROSCOPY: Radiation Exposure Index (as provided by the fluoroscopic device): 14.9 mGy Kerma COMPARISON:  None Available. FINDINGS: Swallowing: Appears normal. No vestibular penetration or aspiration seen. Pharynx: Unremarkable. Esophagus: Normal appearance. Esophageal motility: Within normal limits. Hiatal Hernia: None. Gastroesophageal reflux: None visualized. Ingested 13mm barium tablet: Passed normally. Other: None. IMPRESSION: 1. Normal esophagram. Electronically Signed   By: Obie Dredge M.D.   On: 05/04/2023 11:24   DG Chest 2 View Result Date: 05/04/2023 CLINICAL DATA:  Chronic cough with occasional choking sensation after eating for the past 2 years. No improvement after starting reflux medication. EXAM: CHEST - 2 VIEW COMPARISON:  None Available. FINDINGS: The heart size and mediastinal contours are within normal limits. Both lungs are clear. The visualized skeletal structures are unremarkable. IMPRESSION: No active cardiopulmonary disease. Electronically Signed   By: Obie Dredge M.D.   On: 05/04/2023 10:46    Assessment and Plan:   Candace Vaughn is a 43 y.o. y/o female returns for follow-up of chronic cough of uncertain etiology.  No improvement on PPI plus H2 RB to treat possible GERD/LPRD.  Recent chest x-ray and barium swallow with tablet were normal.  No evidence of hiatal hernia, GERD, or aspiration.  She has no alarm symptoms.  1.  Chronic cough for over a year, uncertain etiology.  Cough is mild and infrequent.  Recent chest x-ray and barium swallow tests were normal.   2.  I am not convinced that she has acid reflux ("silent reflux /  LPRD").  Chronic cough did not improve on PPI plus H2 RB.  I am recommending that she wean off GERD medication.  Stop Pepcid (famotidine) now.  Continue Omeprazole 40mg  1 tablet once daily for 1 month.  If she is doing well in a month, then decrease Omeprazole to 20mg  1 tablet daily for 1 month.  If she is still doing well in 2 months, then she can stop Omeprazole.  If Cough or GERD symptoms worsen, then she will re-start Omeprazole or Pepcid if needed.  3.  Colon cancer screening  She will be due for first screening colonoscopy at age 20.  Colon cancer screening guidelines were discussed.  Celso Amy, PA-C  Follow up as needed if she has recurrent or worsening GI symptoms.

## 2023-05-26 ENCOUNTER — Ambulatory Visit (INDEPENDENT_AMBULATORY_CARE_PROVIDER_SITE_OTHER): Payer: Medicaid Other | Admitting: Physician Assistant

## 2023-05-26 ENCOUNTER — Encounter: Payer: Self-pay | Admitting: Physician Assistant

## 2023-05-26 VITALS — BP 107/74 | HR 76 | Temp 97.9°F | Ht 63.0 in | Wt 202.0 lb

## 2023-05-26 DIAGNOSIS — R053 Chronic cough: Secondary | ICD-10-CM

## 2023-05-26 DIAGNOSIS — R059 Cough, unspecified: Secondary | ICD-10-CM

## 2023-05-26 DIAGNOSIS — K219 Gastro-esophageal reflux disease without esophagitis: Secondary | ICD-10-CM

## 2023-05-26 NOTE — Patient Instructions (Signed)
 Lets try weaning of GERD medicine. Stop Pepcid  now. Continue Omeprazole 40mg  1 tablet once daily for 1 month. If you are doing well in a month, then decrease Omeprazole to 20mg  1 tablet daily for 1 month. If you are still doing well in 2 months, then you can stop Omeprazole. If Cough or GERD symptoms worsen, then re-start Omeprazole if needed.

## 2023-05-28 ENCOUNTER — Other Ambulatory Visit: Payer: Self-pay | Admitting: Nurse Practitioner

## 2023-05-28 DIAGNOSIS — L239 Allergic contact dermatitis, unspecified cause: Secondary | ICD-10-CM

## 2023-05-30 NOTE — Telephone Encounter (Signed)
 Requested medication (s) are due for refill today: yes   Requested medication (s) are on the active medication list: yes   Last refill:  05/02/23 #30 0 refills  Future visit scheduled: yes in 5 months  Notes to clinic:  no refills remain. Do you want to continue refills?     Requested Prescriptions  Pending Prescriptions Disp Refills   hydrOXYzine (VISTARIL) 25 MG capsule [Pharmacy Med Name: hydrOXYzine Pamoate 25 MG Oral Capsule] 30 capsule 0    Sig: TAKE 1 CAPSULE BY MOUTH AT BEDTIME AS NEEDED FOR ITCHING     Ear, Nose, and Throat:  Antihistamines 2 Passed - 05/30/2023  2:32 PM      Passed - Cr in normal range and within 360 days    Creat  Date Value Ref Range Status  11/05/2022 0.79 0.50 - 0.97 mg/dL Final         Passed - Valid encounter within last 12 months    Recent Outpatient Visits           3 months ago Allergic dermatitis   [facility] Berniece Salines, FNP   5 months ago Rosacea   [facility] Berniece Salines, FNP   6 months ago Annual physical exam   [facility] Berniece Salines, FNP   1 year ago Annual physical exam   [facility] Berniece Salines, FNP   1 year ago Skin lesion   [facility] Berniece Salines, FNP       Future Appointments             In 1 week Terri Piedra, DO [facility] Dermatology   In 5 months Zane Herald, Rudolpho Sevin, FNP [facility], [facility]   In 7 months Willeen Niece, MD [facility] [place] [facility]

## 2023-06-07 ENCOUNTER — Ambulatory Visit (INDEPENDENT_AMBULATORY_CARE_PROVIDER_SITE_OTHER): Payer: Medicaid Other | Admitting: Dermatology

## 2023-06-07 ENCOUNTER — Encounter: Payer: Self-pay | Admitting: Dermatology

## 2023-06-07 VITALS — BP 119/76 | HR 73

## 2023-06-07 DIAGNOSIS — Z808 Family history of malignant neoplasm of other organs or systems: Secondary | ICD-10-CM

## 2023-06-07 DIAGNOSIS — L719 Rosacea, unspecified: Secondary | ICD-10-CM | POA: Diagnosis not present

## 2023-06-07 DIAGNOSIS — D2239 Melanocytic nevi of other parts of face: Secondary | ICD-10-CM

## 2023-06-07 DIAGNOSIS — D492 Neoplasm of unspecified behavior of bone, soft tissue, and skin: Secondary | ICD-10-CM | POA: Diagnosis not present

## 2023-06-07 DIAGNOSIS — D485 Neoplasm of uncertain behavior of skin: Secondary | ICD-10-CM

## 2023-06-07 MED ORDER — SAFETY SEAL MISCELLANEOUS MISC: 1.0000 | Freq: Every day | 6 refills | Status: DC

## 2023-06-07 NOTE — Progress Notes (Signed)
 New Patient Visit   Subjective  Candace Vaughn is a 43 y.o. female who presents for the following: Rosacea  Patient states she thinks she has rosacea located at the face that she would like to have examined. Patient reports the areas have been there for a few months. She reports the areas are not bothersome. She states that the areas have not spread. Patient reports she has not previously been treated for these areas.   Patient also reports a small bump on her nose that has been there for about 6 months. She reports it will not go away and has bled occasionally. Patient denies Hx of bx. Patient reports family history of skin cancer(s).  The patient has spots, moles and lesions to be evaluated, some may be new or changing and the patient may have concern these could be cancer.  The following portions of the chart were reviewed this encounter and updated as appropriate: medications, allergies, medical history  Review of Systems:  No other skin or systemic complaints except as noted in HPI or Assessment and Plan.  Objective  Well appearing patient in no apparent distress; mood and affect are within normal limits.  A focused examination was performed of the following areas: Face  Relevant exam findings are noted in the Assessment and Plan.                Fibrous Papule vs BCC Right Ala Nasi 4 mm pink pearly papule  Assessment & Plan   ROSACEA Exam: Mid face erythema with telangiectasias (-) scattered inflammatory papules  Flared  Rosacea is a chronic progressive skin condition usually affecting the face of adults, causing redness and/or acne bumps. It is treatable but not curable. It sometimes affects the eyes (ocular rosacea) as well. It may respond to topical and/or systemic medication and can flare with stress, sun exposure, alcohol, exercise, topical steroids (including hydrocortisone/cortisone 10) and some foods.  Daily application of broad spectrum spf 30+ sunscreen  to face is recommended to reduce flares.  - Assessment: Patient presents with persistent facial redness and telangiectasia. Mild rosacea diagnosed based on clinical presentation. Condition described as genetically influenced with reactive blood vessels. Possible heat sensitivity reported as a trigger. Redness has progressed from intermittent to more persistent over time. - Plan:    Prescribe Metrocream (metronidazole) for topical application morning and night    Prescribe compound medication containing oxymetazoline, metronidazole, and azelaic acid for once daily morning application    Recommend La Roche-Posay Redness Relief moisturizer with sunscreen (samples provided)    Discuss potential for laser treatment (IPL or BBL) for complete redness removal, typically requiring 4 sessions    Follow-up in 4 months to assess progress  2. Suspicious skin lesion - Assessment: Patient reports a persistent, hard spot previously treated with cryotherapy without resolution. Clinician identifies this as a potential early basal cell carcinoma or fibrous papule requiring further investigation. - Plan:    Perform shave biopsy of the lesion    Send biopsy specimen for pathological examination    Provide aftercare instructions: remove bandage at home, keep area clean with soap and water, apply Aquaphor daily    Anticipate results within 1 week, to be communicated via MyChart    Informed consent obtained for biopsy procedure ROSACEA   NEOPLASM OF UNCERTAIN BEHAVIOR OF SKIN Right Ala Nasi Epidermal / dermal shaving  Lesion diameter (cm):  0.4 Informed consent: discussed and consent obtained   Timeout: patient name, date of birth, surgical site, and  procedure verified   Procedure prep:  Patient was prepped and draped in usual sterile fashion Prep type:  Isopropyl alcohol Anesthesia: the lesion was anesthetized in a standard fashion   Anesthetic:  1% lidocaine w/ epinephrine 1-100,000 buffered w/ 8.4%  NaHCO3 Instrument used: DermaBlade   Hemostasis achieved with: aluminum chloride   Outcome: patient tolerated procedure well   Post-procedure details: sterile dressing applied and wound care instructions given   Dressing type: petrolatum   Additional details:  Pt aware that benign results will be sent to mychart and the staff will call abnormal results will Specimen 1 - Surgical pathology Differential Diagnosis: Fibrous Papule vs BCC  Check Margins: No  Return in about 4 months (around 10/07/2023) for Rosacea F/U.  Documentation: I have reviewed the above documentation for accuracy and completeness, and I agree with the above.  Stasia Cavalier, am acting as scribe for Langston Reusing, DO.  Langston Reusing, DO

## 2023-06-07 NOTE — Patient Instructions (Addendum)
 Hello Candace Vaughn,  Thank you for visiting today. Here is a summary of the key instructions:  - Medications:   - Apply compound medication from [facility] (oxymetazoline, metronidazole, azelaic acid) once daily in the morning   - Apply Aquaphor to biopsy site daily  - Procedures:   - Shave biopsy performed today  - Skin Care:   - Use La Roche-Posay Redness Relief daily moisturizer with sunscreen   - Keep biopsy site clean with soap and water   - Remove bandage from biopsy site when you get home  - Referrals:   - Consider laser treatment (IPL or BBL) for rosacea at our aesthetics office   - Dr. Gwen Pounds or Dr. Roseanne Reno perform this treatment  - Follow-up:   - Return in 4 months to check progress   - Biopsy results will be available in about 1 week on MyChart  Please reach out if you have any questions or concerns.  Warm regards,  Dr. Langston Reusing, Dermatology  . Patient Handout: Wound Care for Skin Biopsy Site  Taking Care of Your Skin Biopsy Site  Proper care of the biopsy site is essential for promoting healing and minimizing scarring. This handout provides instructions on how to care for your biopsy site to ensure optimal recovery.  1. Cleaning the Wound:  Clean the biopsy site daily with gentle soap and water. Gently pat the area dry with a clean, soft towel. Avoid harsh scrubbing or rubbing the area, as this can irritate the skin and delay healing.  2. Applying Aquaphor and Bandage:  After cleaning the wound, apply a thin layer of Aquaphor ointment to the biopsy site. Cover the area with a sterile bandage to protect it from dirt, bacteria, and friction. Change the bandage daily or as needed if it becomes soiled or wet.  3. Continued Care for One Week:  Repeat the cleaning, Aquaphor application, and bandaging process daily for one week following the biopsy procedure. Keeping the wound clean and moist during this initial healing period will help prevent  infection and promote optimal healing.  4. Massaging Aquaphor into the Area:  ---After one week, discontinue the use of bandages but continue to apply Aquaphor to the biopsy site. ----Gently massage the Aquaphor into the area using circular motions. ---Massaging the skin helps to promote circulation and prevent the formation of scar tissue.   Additional Tips:  Avoid exposing the biopsy site to direct sunlight during the healing process, as this can cause hyperpigmentation or worsen scarring. If you experience any signs of infection, such as increased redness, swelling, warmth, or drainage from the wound, contact your healthcare provider immediately. Follow any additional instructions provided by your healthcare provider for caring for the biopsy site and managing any discomfort. Conclusion:  Taking proper care of your skin biopsy site is crucial for ensuring optimal healing and minimizing scarring. By following these instructions for cleaning, applying Aquaphor, and massaging the area, you can promote a smooth and successful recovery. If you have any questions or concerns about caring for your biopsy site, don't hesitate to contact your healthcare provider for guidance.       Important Information   Due to recent changes in healthcare laws, you may see results of your pathology and/or laboratory studies on MyChart before the doctors have had a chance to review them. We understand that in some cases there may be results that are confusing or concerning to you. Please understand that not all results are received at the same time  and often the doctors may need to interpret multiple results in order to provide you with the best plan of care or course of treatment. Therefore, we ask that you please give [place] 2 business days to thoroughly review all your results before contacting the office for clarification. Should we see a critical lab result, you will be contacted sooner.     If You Need  Anything After Your Visit   If you have any questions or concerns for your doctor, please call our main line at [phone]. If no one answers, please leave a voicemail as directed and we will return your call as soon as possible. Messages left after 4 pm will be answered the following business day.    You may also send [place] a message via MyChart. We typically respond to MyChart messages within 1-2 business days.  For prescription refills, please ask your pharmacy to contact our office. Our fax number is [phone].  If you have an urgent issue when the clinic is closed that cannot wait until the next business day, you can page your doctor at the number below.     Please note that while we do our best to be available for urgent issues outside of office hours, we are not available 24/7.    If you have an urgent issue and are unable to reach [place], you may choose to seek medical care at your doctor's office, retail clinic, urgent care center, or emergency room.   If you have a medical emergency, please immediately call 911 or go to the emergency department. In the event of inclement weather, please call our main line at [phone] for an update on the status of any delays or closures.  Dermatology Medication Tips: Please keep the boxes that topical medications come in in order to help keep track of the instructions about where and how to use these. Pharmacies typically print the medication instructions only on the boxes and not directly on the medication tubes.   If your medication is too expensive, please contact our office at [phone] or send [place] a message through MyChart.    We are unable to tell what your co-pay for medications will be in advance as this is different depending on your insurance coverage. However, we may be able to find a substitute medication at lower cost or fill out paperwork to get insurance to cover a needed medication.    If a prior authorization is required to get  your medication covered by your insurance company, please allow [place] 1-2 business days to complete this process.   Drug prices often vary depending on where the prescription is filled and some pharmacies may offer cheaper prices.   The website www.goodrx.com contains coupons for medications through different pharmacies. The prices here do not account for what the cost may be with help from insurance (it may be cheaper with your insurance), but the website can give you the price if you did not use any insurance.  - You can print the associated coupon and take it with your prescription to the pharmacy.  - You may also stop by our office during regular business hours and pick up a GoodRx coupon card.  - If you need your prescription sent electronically to a different pharmacy, notify our office through [facility] or by phone at [phone]

## 2023-06-08 ENCOUNTER — Encounter: Payer: Self-pay | Admitting: Dermatology

## 2023-06-09 ENCOUNTER — Encounter: Payer: Self-pay | Admitting: Dermatology

## 2023-06-09 LAB — SURGICAL PATHOLOGY

## 2023-06-09 NOTE — Progress Notes (Signed)
 Hi Candace Vaughn  Dr. Onalee Hua reviewed your biopsy results and they showed the spot removed was benign (not cancerous).  No additional treatment is required.  The detailed report is available to view in MyChart.  Have a great day!  Kind Regards,  Dr. Kermit Balo Care Team

## 2023-06-12 ENCOUNTER — Encounter: Payer: Self-pay | Admitting: Nurse Practitioner

## 2023-06-14 NOTE — Progress Notes (Unsigned)
 There were no vitals taken for this visit.   Subjective:    Patient ID: Candace Vaughn, female    DOB: [birth year], 42 y.o.   MRN: [id number]  HPI: Candace Vaughn is a 43 y.o. female  No chief complaint on file.   Discussed the use of AI scribe software for clinical note transcription with the patient, who gave verbal consent to proceed.  History of Present Illness          12/22/2022    8:11 AM 11/05/2022    9:09 AM 11/03/2021    3:12 PM  Depression screen PHQ 2/9  Decreased Interest 0 0 0  Down, Depressed, Hopeless 1 0 1  PHQ - 2 Score 1 0 1  Altered sleeping  2 1  Tired, decreased energy  0 1  Change in appetite  0 0  Feeling bad or failure about yourself   0 0  Trouble concentrating  0 1  Moving slowly or fidgety/restless  0 1  Suicidal thoughts  0 0  PHQ-9 Score  2 5  Difficult doing work/chores  Not difficult at all Not difficult at all    Relevant past medical, surgical, family and social history reviewed and updated as indicated. Interim medical history since our last visit reviewed. Allergies and medications reviewed and updated.  Review of Systems  Per HPI unless specifically indicated above     Objective:    There were no vitals taken for this visit.  {Vitals History (Optional):23777} Wt Readings from Last 3 Encounters:  05/26/23 202 lb (91.6 kg)  04/28/23 203 lb 9.6 oz (92.4 kg)  03/17/23 201 lb 9.6 oz (91.4 kg)    Physical Exam Physical Exam    Results for orders placed or performed in visit on 06/07/23  Surgical pathology   Collection Time: 06/07/23 12:00 AM  Result Value Ref Range   SURGICAL PATHOLOGY      SURGICAL PATHOLOGY [facility] [address] [place], [place] [address] Telephone [phone] or [phone] Fax [phone]  REPORT OF DERMATOPATHOLOGY   Accession #: [id number] Patient Name: NYIAH, PIANKA Visit # : [id number]  MRN: [id number] Cytotechnologist:  Munoz-Bishop Md, Efraim Kaufmann, Dermatopathologist, Electronic Signature DOB/Age [birth year] (Age: 64) Gender: F Collected Date: 06/07/2023 Received Date: 06/07/2023  FINAL DIAGNOSIS       1. Skin, right ala nasi :       FIBROUS PAPULE       DATE SIGNED OUT: 06/09/2023 ELECTRONIC SIGNATURE : Munoz-Bishop Md, Melissa, Dermatopathologist, Electronic Signature  MICROSCOPIC DESCRIPTION 1. There is a prominence of fibrous tissue within the superficial dermis with scattered stellate cells and vascular ectasia.  There is no atypia.  These changes correlate with a fibrous papule.  Following review of H&E sections, a Melan-A and  S-100 stains are negative.  The CD-68 stain  is focally positive.  These changes extend to the base of the biopsy.  Multiple sections have been examined.  CASE COMMENTS STAINS USED IN DIAGNOSIS: H&E *RECUT DEEPER X 1 Universal Negative Control-Bond Stains used in diagnosis 1 *MELAN A Bond (Red), 1 *S-100 (Red), 1 CD 68 Some of these immunohistochemical stains may have been developed and the performance characteristics determined by [facility].  Some may not have been cleared or approved by the U.S. Food and Drug Administration.  The FDA has determined that such clearance or approval is not necessary.  This test is used for clinical purposes.  It should not be regarded  as investigational or for research.  This laboratory is certified under the Clinical Laboratory Improvement Amendments of 1988 (CLIA-88) as qualified to perform high complexity clinical laboratory testing.    CLINICAL HISTORY  SPECIMEN(S) OBTAINED 1. Skin, Right Ala Nasi  SPECIMEN COMMENTS: 1. 4mm pink pearly papule SPECIMEN  CLINICAL INFORMATION: 1. Neoplasm of uncertain behavior of skin, fibrous papule vs BCC    Gross Description 1. Formalin fixed specimen received:  4 X 3 X 1 MM, TOTO (2 P) (1 B) ( alb )        Report signed out from the following location(s) [facility] [address] [place], [place] [address] CLIA #: [id number]  [facility] [address] [place], [place] [address] CLIA #: [id number]    {Labs (Optional):23779}    Assessment & Plan:   Problem List Items Addressed This Visit   None    Assessment and Plan Assessment & Plan         Follow up plan: No follow-ups on file.

## 2023-06-15 ENCOUNTER — Ambulatory Visit (INDEPENDENT_AMBULATORY_CARE_PROVIDER_SITE_OTHER): Admitting: Nurse Practitioner

## 2023-06-15 ENCOUNTER — Encounter: Payer: Self-pay | Admitting: Nurse Practitioner

## 2023-06-15 VITALS — BP 116/66 | HR 98 | Temp 97.8°F | Resp 18 | Ht 63.0 in | Wt 199.5 lb

## 2023-06-15 DIAGNOSIS — F332 Major depressive disorder, recurrent severe without psychotic features: Secondary | ICD-10-CM | POA: Insufficient documentation

## 2023-06-15 DIAGNOSIS — F419 Anxiety disorder, unspecified: Secondary | ICD-10-CM

## 2023-06-15 MED ORDER — ALPRAZOLAM 0.25 MG PO TABS
0.2500 mg | ORAL_TABLET | Freq: Two times a day (BID) | ORAL | 0 refills | Status: AC | PRN
Start: 2023-06-15 — End: ?

## 2023-06-15 MED ORDER — AUVELITY 45-105 MG PO TBCR
1.0000 | EXTENDED_RELEASE_TABLET | Freq: Every day | ORAL | 0 refills | Status: DC
Start: 2023-06-15 — End: 2023-07-13

## 2023-06-17 ENCOUNTER — Telehealth: Payer: Self-pay

## 2023-06-17 NOTE — Telephone Encounter (Signed)
 [facility] [employer] prior auth on   Dextromethorphan-buPROPion ER (AUVELITY) 45-105 MG TBCR    [employer]

## 2023-06-20 NOTE — Telephone Encounter (Signed)
 PA denied. Per the health plan's preferred drug list, at least 2 preferred drugs must be tried before requesting this drug

## 2023-06-20 NOTE — Telephone Encounter (Signed)
PA submitted waiting for determination

## 2023-06-21 ENCOUNTER — Telehealth: Payer: Self-pay | Admitting: Nurse Practitioner

## 2023-06-21 NOTE — Telephone Encounter (Unsigned)
 Copied from CRM [id number]. Topic: General - Other >> Jun 21, 2023  4:23 PM Geneva B wrote: Reason for CRM: [employer] is calling because they want an update on appeal for looking for the original denial  for extromethorphan-buPROPion ER (AUVELITY) 45-105 MG TBCR [phone] or fax [phone]

## 2023-06-23 ENCOUNTER — Encounter: Payer: Self-pay | Admitting: Nurse Practitioner

## 2023-06-25 DIAGNOSIS — Z419 Encounter for procedure for purposes other than remedying health state, unspecified: Secondary | ICD-10-CM | POA: Diagnosis not present

## 2023-06-25 IMAGING — US US THYROID
1 series · 14 of 25 positions shown · non-contrast
Comparison: 12/12/2014

CLINICAL DATA: Thyromegaly

EXAM:
THYROID ULTRASOUND
TECHNIQUE: Ultrasound examination of the thyroid gland and adjacent soft
tissues was performed.

[Series 1: us thyroid · 0.07mm/px · 14 of 37 slices shown]
[im 1/37]
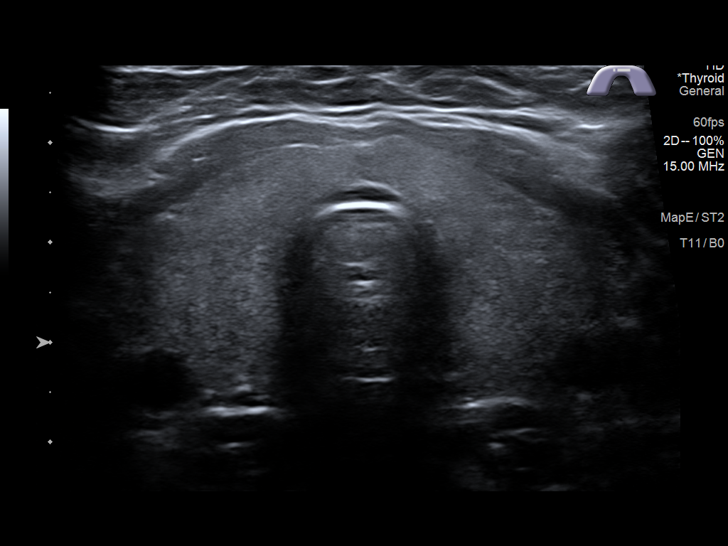
[im 4/37]
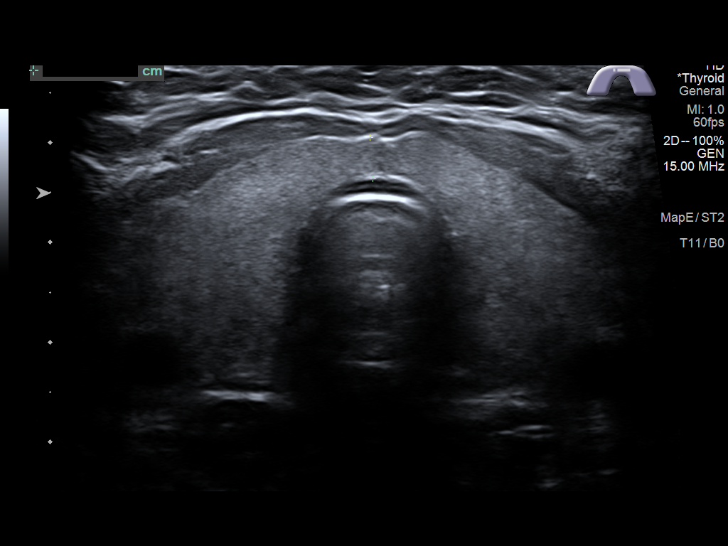
[im 7/37]
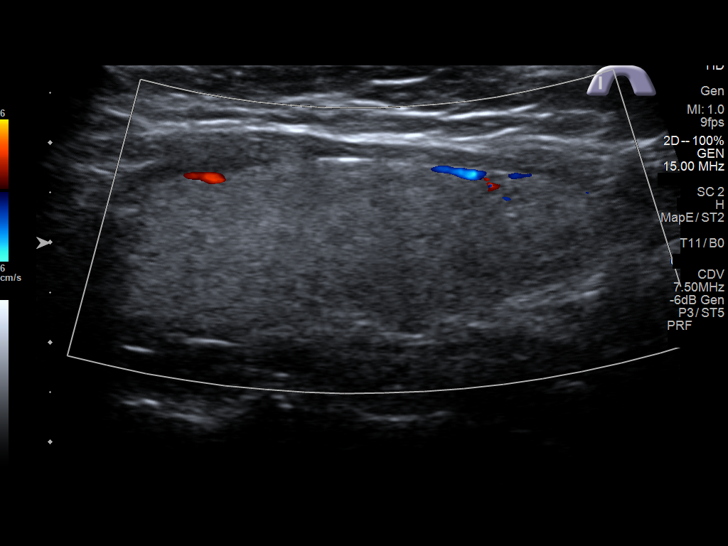
[im 10/37]
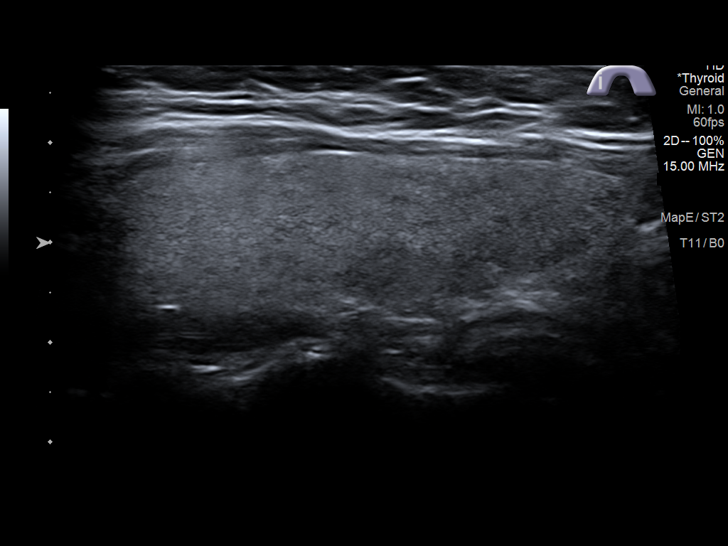
[im 13/37]
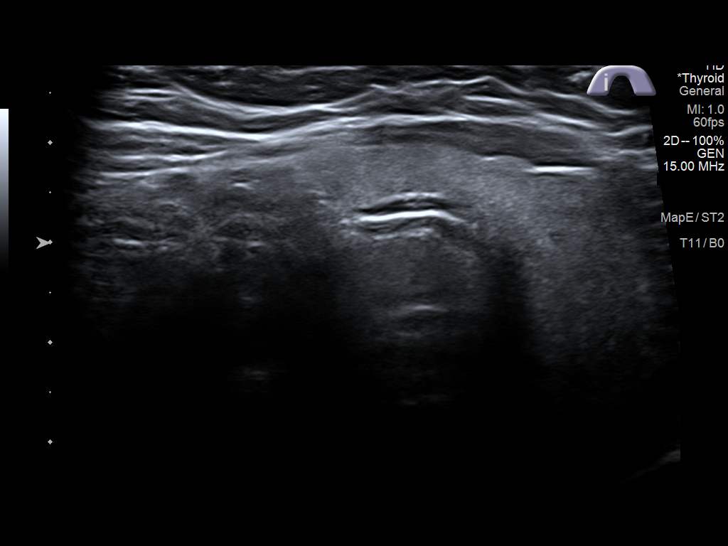
[im 14/37]
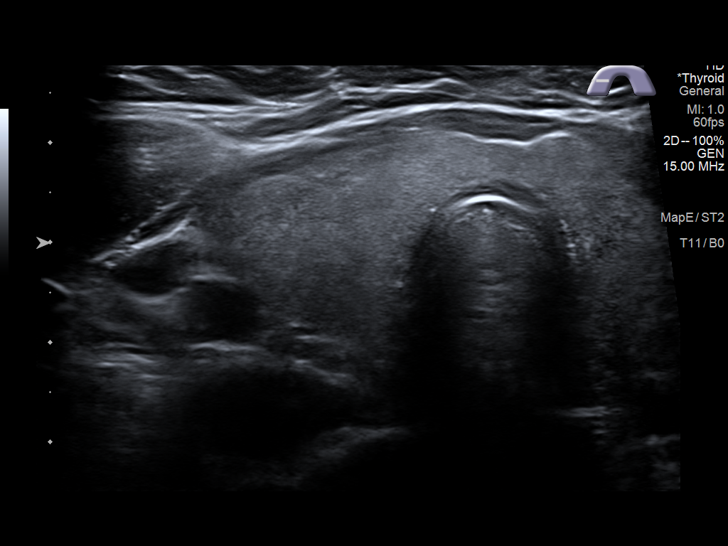
[im 17/37]
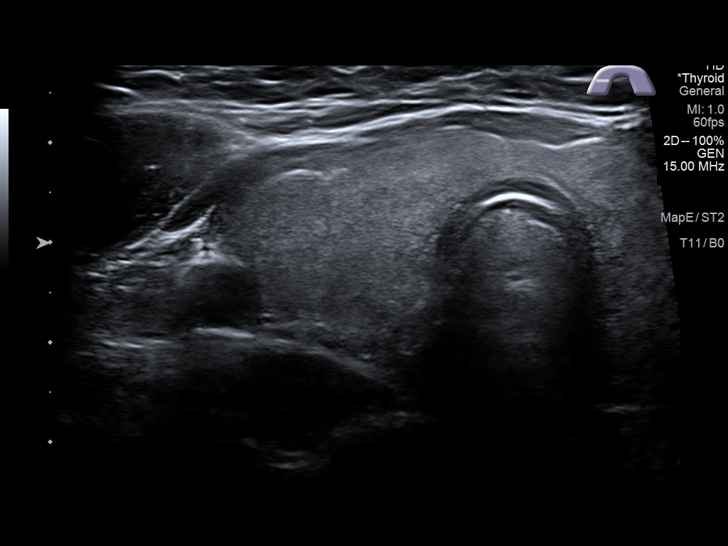
[im 20/37]
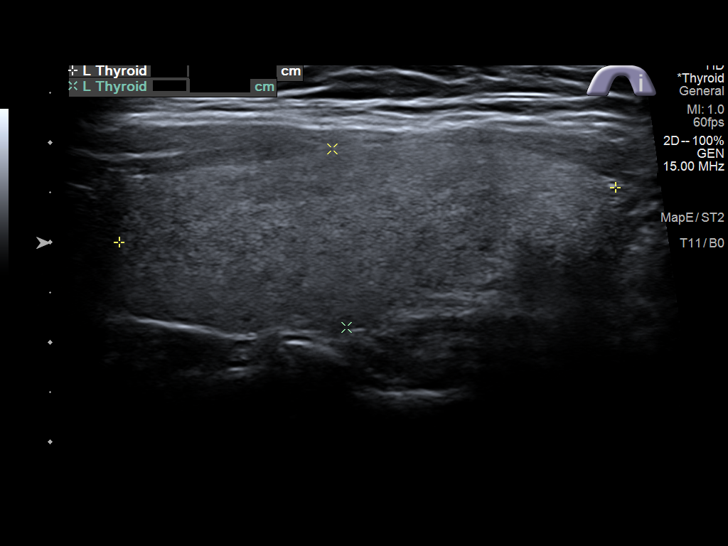
[im 23/37]
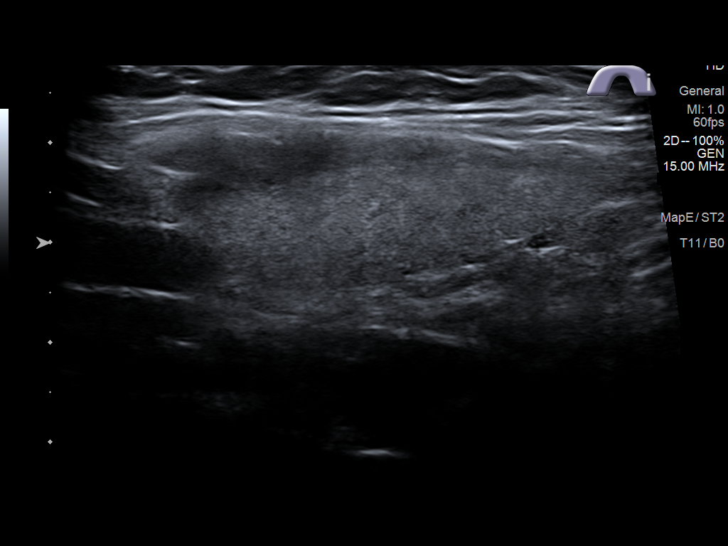
[im 25/37]
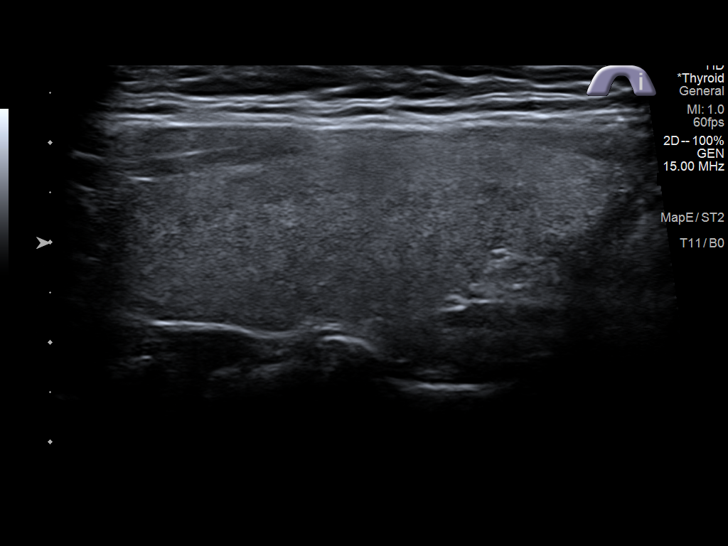
[im 28/37]
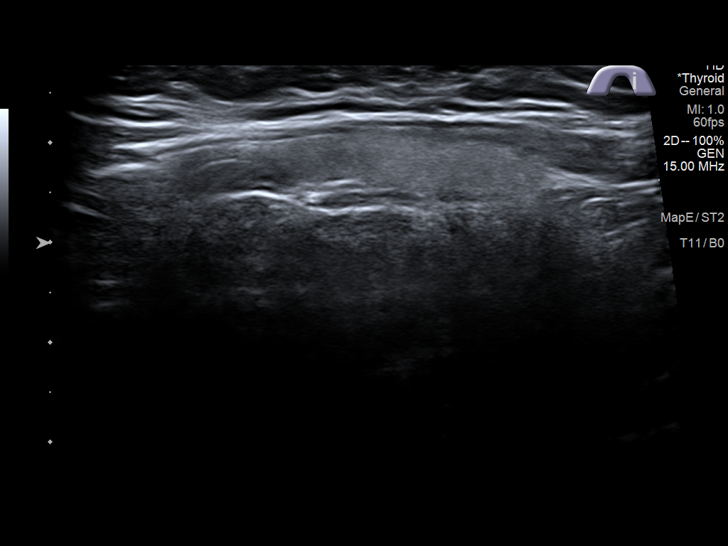
[im 31/37]
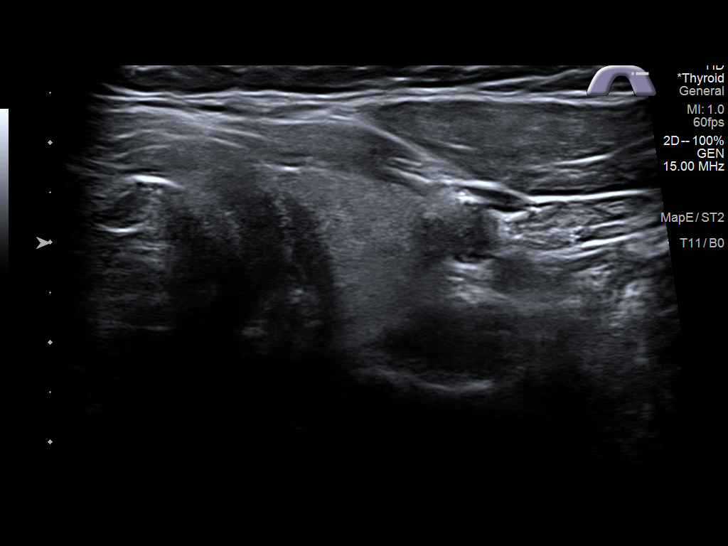
[im 34/37]
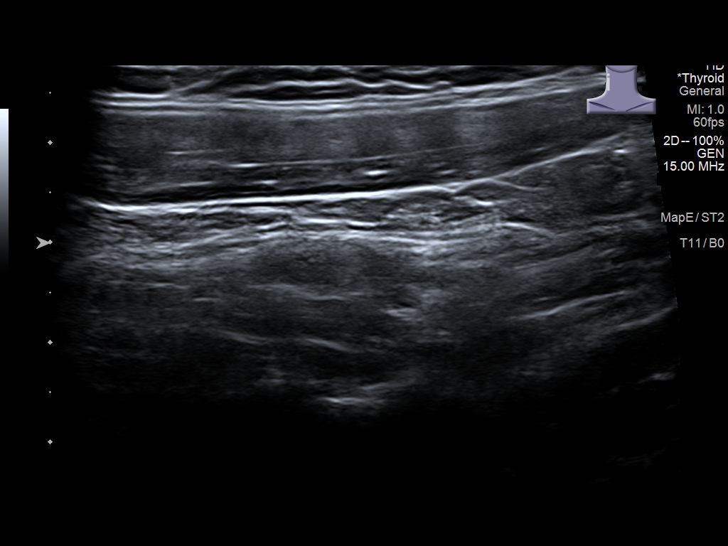
[im 37/37]
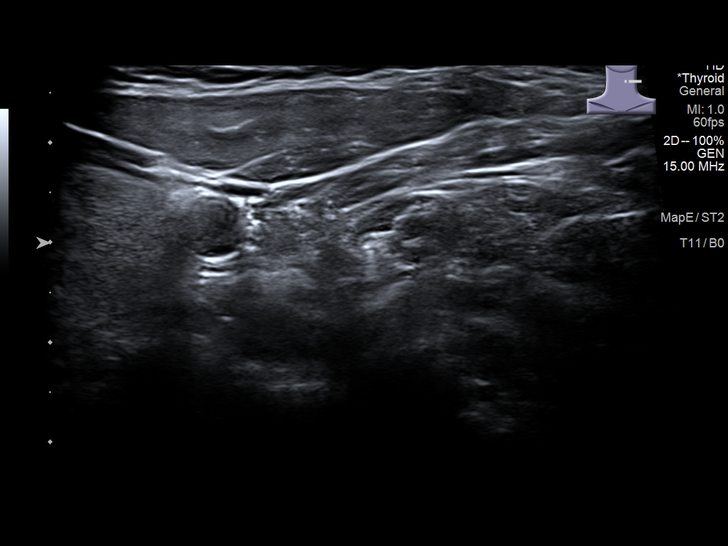

[14 of 25 positions shown; findings below may reference images not displayed]

FINDINGS: Parenchymal Echotexture: Mildly heterogenous

Isthmus: 4 mm

Right lobe: 5.2 x 1.5 x 2.1 cm

Left lobe: 5.0 x 1.8 x 1.7 cm

_________________________________________________________

Estimated total number of nodules >/= 1 cm: 0

Number of spongiform nodules >/=  2 cm not described below (TR1): 0

Number of mixed cystic and solid nodules >/= 1.5 cm not described
below (TR2): 0

_________________________________________________________

No discrete nodules are seen within the thyroid gland.
IMPRESSION: Similar minor thyromegaly without acute finding or nodule.

The above is in keeping with the ACR TI-RADS recommendations - [HOSPITAL] 1582;[DATE].

## 2023-07-11 ENCOUNTER — Other Ambulatory Visit: Payer: Self-pay | Admitting: Nurse Practitioner

## 2023-07-11 DIAGNOSIS — G43009 Migraine without aura, not intractable, without status migrainosus: Secondary | ICD-10-CM

## 2023-07-13 ENCOUNTER — Encounter: Payer: Self-pay | Admitting: Nurse Practitioner

## 2023-07-13 ENCOUNTER — Other Ambulatory Visit: Payer: Self-pay

## 2023-07-13 ENCOUNTER — Ambulatory Visit: Admitting: Nurse Practitioner

## 2023-07-13 VITALS — BP 124/76 | HR 74 | Temp 97.9°F | Resp 16 | Ht 63.0 in | Wt 200.5 lb

## 2023-07-13 DIAGNOSIS — F332 Major depressive disorder, recurrent severe without psychotic features: Secondary | ICD-10-CM

## 2023-07-13 DIAGNOSIS — F419 Anxiety disorder, unspecified: Secondary | ICD-10-CM

## 2023-07-13 MED ORDER — BUPROPION HCL ER (XL) 150 MG PO TB24
150.0000 mg | ORAL_TABLET | Freq: Every day | ORAL | 0 refills | Status: DC
Start: 2023-07-13 — End: 2023-08-10

## 2023-07-13 NOTE — Progress Notes (Signed)
 BP 124/76 (Cuff Size: Large)   Pulse 74   Temp 97.9 F (36.6 C) (Oral)   Resp 16   Ht 5\' 3"  (1.6 m)   Wt 200 lb 8 oz (90.9 kg)   SpO2 94%   BMI 35.52 kg/m    Subjective:    Patient ID: Candace Vaughn, female    DOB: 09/03/1980, 42 y.o.   MRN: [id number]  HPI: Candace Vaughn is a 43 y.o. female  Chief Complaint  Patient presents with   Follow-up    4 week recheck    Discussed the use of AI scribe software for clinical note transcription with the patient, who gave verbal consent to proceed.  History of Present Illness Candace Vaughn is a 43 year old female who presents with increased anxiety.  She experiences increased anxiety, exacerbated by her husband's recent decline in health. She has tried multiple medications for anxiety, including Zoloft and Paxil, which were ineffective and caused her to feel 'really bad,'. She says that her anxiety makes her feel a sensation of 'a wet blanket' over her, leaving her physically drained despite mental alertness.  Buspar  7.5 mg TID was also tried but was ineffective. She was given a one-time prescription for Xanax  and was prescribed Auvelity , but encountered insurance issues with coverage, leading to a cost of $580, which she could not afford. She has been involved in an appeal process with her insurance but has not received any updates.         07/13/2023    7:52 AM 06/15/2023    8:20 AM 12/22/2022    8:11 AM  Depression screen PHQ 2/9  Decreased Interest 1 1 0  Down, Depressed, Hopeless 2 3 1   PHQ - 2 Score 3 4 1   Altered sleeping 1 2   Tired, decreased energy 2 3   Change in appetite 0 1   Feeling bad or failure about yourself  1 0   Trouble concentrating 2 2   Moving slowly or fidgety/restless 1 1   Suicidal thoughts 0 0   PHQ-9 Score 10 13   Difficult doing work/chores Somewhat difficult Somewhat difficult        07/13/2023    7:53 AM 06/15/2023    8:21 AM 11/05/2022    9:09 AM 11/03/2021    3:13 PM   GAD 7 : Generalized Anxiety Score  Nervous, Anxious, on Edge 1 3 1 1   Control/stop worrying 1 3 0 1  Worry too much - different things 2 3 1 1   Trouble relaxing 2 3 1 1   Restless 2 2 1 1   Easily annoyed or irritable 1 0 0 0  Afraid - awful might happen 2 3 1 1   Total GAD 7 Score 11 17 5 6   Anxiety Difficulty Somewhat difficult Somewhat difficult Somewhat difficult Not difficult at all     Relevant past medical, surgical, family and social history reviewed and updated as indicated. Interim medical history since our last visit reviewed. Allergies and medications reviewed and updated.  Review of Systems  Constitutional: Negative for fever or weight change.  Respiratory: Negative for cough and shortness of breath.   Cardiovascular: Negative for chest pain or palpitations.  Gastrointestinal: Negative for abdominal pain, no bowel changes.  Musculoskeletal: Negative for gait problem or joint swelling.  Skin: Negative for rash.  Neurological: Negative for dizziness or headache.  No other specific complaints in a complete review of systems (except as listed in HPI above).  Objective:    BP 124/76 (Cuff Size: Large)   Pulse 74   Temp 97.9 F (36.6 C) (Oral)   Resp 16   Ht 5\' 3"  (1.6 m)   Wt 200 lb 8 oz (90.9 kg)   SpO2 94%   BMI 35.52 kg/m    Wt Readings from Last 3 Encounters:  07/13/23 200 lb 8 oz (90.9 kg)  06/15/23 199 lb 8 oz (90.5 kg)  05/26/23 202 lb (91.6 kg)    Physical Exam Vitals reviewed.  Constitutional:      Appearance: Normal appearance.  HENT:     Head: Normocephalic.  Cardiovascular:     Rate and Rhythm: Normal rate.  Pulmonary:     Effort: Pulmonary effort is normal.  Neurological:     General: No focal deficit present.     Mental Status: She is alert and oriented to person, place, and time. Mental status is at baseline.  Psychiatric:        Mood and Affect: Mood normal.        Behavior: Behavior normal.        Thought Content: Thought  content normal.        Judgment: Judgment normal.     Results for orders placed or performed in visit on 06/07/23  Surgical pathology   Collection Time: 06/07/23 12:00 AM  Result Value Ref Range   SURGICAL PATHOLOGY      SURGICAL PATHOLOGY [facility] [address] [place], [place] [address] Telephone [phone] or [phone] Fax [phone]  REPORT OF DERMATOPATHOLOGY   Accession #: [id number] Patient Name: Candace Vaughn, Candace Vaughn Visit # : [id number]  MRN: [id number] Cytotechnologist: Munoz-Bishop Md, Lovett Ruck, Dermatopathologist, Electronic Signature DOB/Age 12/03/1980 (Age: 24) Gender: F Collected Date: 06/07/2023 Received Date: 06/07/2023  FINAL DIAGNOSIS       1. Skin, right ala nasi :       FIBROUS PAPULE       DATE SIGNED OUT: 06/09/2023 ELECTRONIC SIGNATURE : Munoz-Bishop Md, Melissa, Dermatopathologist, Electronic Signature  MICROSCOPIC DESCRIPTION 1. There is a prominence of fibrous tissue within the superficial dermis with scattered stellate cells and vascular ectasia.  There is no atypia.  These changes correlate with a fibrous papule.  Following review of H&E sections, a Melan-A and  S-100 stains are negative.  The CD-68 stain  is focally positive.  These changes extend to the base of the biopsy.  Multiple sections have been examined.  CASE COMMENTS STAINS USED IN DIAGNOSIS: H&E *RECUT DEEPER X 1 Universal Negative Control-Bond Stains used in diagnosis 1 *MELAN A Bond (Red), 1 *S-100 (Red), 1 CD 68 Some of these immunohistochemical stains may have been developed and the performance characteristics determined by [facility].  Some may not have been cleared or approved by the U.S. Food and Drug Administration.  The FDA has determined that such clearance or approval is not necessary.  This test is used for clinical purposes.  It should not be regarded as investigational or for research.  This laboratory is  certified under the Clinical Laboratory Improvement Amendments of 1988 (CLIA-88) as qualified to perform high complexity clinical laboratory testing.    CLINICAL HISTORY  SPECIMEN(S) OBTAINED 1. Skin, Right Ala Nasi  SPECIMEN COMMENTS: 1. 4mm pink pearly papule SPECIMEN  CLINICAL INFORMATION: 1. Neoplasm of uncertain behavior of skin, fibrous papule vs BCC    Gross Description 1. Formalin fixed specimen received:  4 X 3 X 1 MM, TOTO (2 P) (1 B) (  alb )        Report signed out from the following location(s) [place]. [facility] [address] [place], [place] [address] CLIA #: [id number]  [facility] [address] [place], [place] [address] CLIA #: [id number]        Assessment & Plan:   Problem List Items Addressed This Visit       Other   Anxiety - Primary   Relevant Medications   buPROPion (WELLBUTRIN XL) 150 MG 24 hr tablet   Severe episode of recurrent major depressive disorder, without psychotic features (HCC)   Relevant Medications   buPROPion (WELLBUTRIN XL) 150 MG 24 hr tablet     Assessment and Plan Assessment & Plan Anxiety disorder Chronic anxiety exacerbated by husband's recent health issues. Previous trials of sertraline, paroxetine, and buspirone  were ineffective. Auvelity  was not covered by insurance. Discussed trial of bupropion, noting potential for increased anxiety due to its energizing effects. Bupropion is expected to act quickly. Common side effects include headaches and xerostomia; rare side effects include jitteriness and brain zaps. - Prescribe bupropion - Schedule follow-up appointment in 4 weeks - Instruct her to report any issues with obtaining medication        Follow up plan: Return in about 4 weeks (around 08/10/2023) for follow up.

## 2023-07-13 NOTE — Telephone Encounter (Signed)
 Requested Prescriptions  Pending Prescriptions Disp Refills   propranolol  (INDERAL ) 80 MG tablet [Pharmacy Med Name: Propranolol  HCl 80 MG Oral Tablet] 90 tablet 0    Sig: Take 1 tablet by mouth once daily     Cardiovascular:  Beta Blockers Failed - 07/13/2023 11:14 AM      Failed - Valid encounter within last 6 months    Recent Outpatient Visits           Today Anxiety   [facility] Donny Gall F, FNP   4 weeks ago Severe episode of recurrent major depressive disorder, without psychotic features [facility])   [facility] Quinton Buckler, FNP       Future Appointments             In 4 weeks Abram Hoguet Monalisa Angles, FNP [facility], [facility]   In 3 months Abram Hoguet, Monalisa Angles, FNP [facility], [facility]   In 5 months Artemio Larry, MD [facility]            Passed - Last BP in normal range    BP Readings from Last 1 Encounters:  07/13/23 124/76         Passed - Last Heart Rate in normal range    Pulse Readings from Last 1 Encounters:  07/13/23 74

## 2023-07-25 DIAGNOSIS — Z419 Encounter for procedure for purposes other than remedying health state, unspecified: Secondary | ICD-10-CM | POA: Diagnosis not present

## 2023-07-26 ENCOUNTER — Other Ambulatory Visit (HOSPITAL_COMMUNITY): Payer: Self-pay

## 2023-08-06 ENCOUNTER — Other Ambulatory Visit: Payer: Self-pay | Admitting: Nurse Practitioner

## 2023-08-06 DIAGNOSIS — F332 Major depressive disorder, recurrent severe without psychotic features: Secondary | ICD-10-CM

## 2023-08-06 DIAGNOSIS — F419 Anxiety disorder, unspecified: Secondary | ICD-10-CM

## 2023-08-09 NOTE — Progress Notes (Deleted)
 There were no vitals taken for this visit.   Subjective:    Patient ID: Candace Vaughn, female    DOB: 02-07-1981, 43 y.o.   MRN: [id number]  HPI: Candace Vaughn is a 43 y.o. female  No chief complaint on file.   Discussed the use of AI scribe software for clinical note transcription with the patient, who gave verbal consent to proceed.  History of Present Illness          07/13/2023    7:52 AM 06/15/2023    8:20 AM 12/22/2022    8:11 AM  Depression screen PHQ 2/9  Decreased Interest 1 1 0  Down, Depressed, Hopeless 2 3 1   PHQ - 2 Score 3 4 1   Altered sleeping 1 2   Tired, decreased energy 2 3   Change in appetite 0 1   Feeling bad or failure about yourself  1 0   Trouble concentrating 2 2   Moving slowly or fidgety/restless 1 1   Suicidal thoughts 0 0   PHQ-9 Score 10 13   Difficult doing work/chores Somewhat difficult Somewhat difficult     Relevant past medical, surgical, family and social history reviewed and updated as indicated. Interim medical history since our last visit reviewed. Allergies and medications reviewed and updated.  Review of Systems  Per HPI unless specifically indicated above     Objective:      There were no vitals taken for this visit.  {Vitals History (Optional):23777} Wt Readings from Last 3 Encounters:  07/13/23 200 lb 8 oz (90.9 kg)  06/15/23 199 lb 8 oz (90.5 kg)  05/26/23 202 lb (91.6 kg)    Physical Exam Physical Exam    Results for orders placed or performed in visit on 06/07/23  Surgical pathology   Collection Time: 06/07/23 12:00 AM  Result Value Ref Range   SURGICAL PATHOLOGY      SURGICAL PATHOLOGY [facility] [address] [place], [place] [address] Telephone [phone] or [phone] Fax [phone]  REPORT OF DERMATOPATHOLOGY   Accession #: [id number] Patient Name: Candace Vaughn Visit # : [id number]  MRN: [id number] Cytotechnologist: Munoz-Bishop  Md, Candace Vaughn, Dermatopathologist, Electronic Signature DOB/Age [birth year] (Age: 72) Gender: F Collected Date: 06/07/2023 Received Date: 06/07/2023  FINAL DIAGNOSIS       1. Skin, right ala nasi :       FIBROUS PAPULE       DATE SIGNED OUT: 06/09/2023 ELECTRONIC SIGNATURE : Munoz-Bishop Md, Candace Vaughn, Dermatopathologist, Electronic Signature  MICROSCOPIC DESCRIPTION 1. There is a prominence of fibrous tissue within the superficial dermis with scattered stellate cells and vascular ectasia.  There is no atypia.  These changes correlate with a fibrous papule.  Following review of H&E sections, a Melan-A and  S-100 stains are negative.  The CD-68 stain  is focally positive.  These changes extend to the base of the biopsy.  Multiple sections have been examined.  CASE COMMENTS STAINS USED IN DIAGNOSIS: H&E *RECUT DEEPER X 1 Universal Negative Control-Bond Stains used in diagnosis 1 *MELAN A Bond (Red), 1 *S-100 (Red), 1 CD 68 Some of these immunohistochemical stains may have been developed and the performance characteristics determined by [facility].  Some may not have been cleared or approved by the U.S. Food and Drug Administration.  The FDA has determined that such clearance or approval is not necessary.  This test is used for clinical purposes.  It should not be regarded as investigational  or for research.  This laboratory is certified under the Clinical Laboratory Improvement Amendments of 1988 (CLIA-88) as qualified to perform high complexity clinical laboratory testing.    CLINICAL HISTORY  SPECIMEN(S) OBTAINED 1. Skin, Right Ala Nasi  SPECIMEN COMMENTS: 1. 4mm pink pearly papule SPECIMEN  CLINICAL INFORMATION: 1. Neoplasm of uncertain behavior of skin, fibrous papule vs BCC    Gross Description 1. Formalin fixed specimen received:  4 X 3 X 1 MM, TOTO (2 P) (1 B) ( alb )        Report signed out from the following location(s) [place]. [facility] [address] [place], [place] [address] CLIA #: [id number]  [facility] [address] [place], [place] [address] CLIA #: [id number]    {Labs (Optional):23779}       Assessment & Plan:   Problem List Items Addressed This Visit   None    Assessment and Plan Assessment & Plan         Follow up plan: No follow-ups on file.

## 2023-08-10 ENCOUNTER — Ambulatory Visit: Admitting: Nurse Practitioner

## 2023-08-10 NOTE — Telephone Encounter (Signed)
 Requested Prescriptions  Pending Prescriptions Disp Refills   buPROPion  (WELLBUTRIN  XL) 150 MG 24 hr tablet [Pharmacy Med Name: buPROPion  HCl ER (XL) 150 MG Oral Tablet Extended Release 24 Hour] 90 tablet 0    Sig: Take 1 tablet by mouth once daily     Psychiatry: Antidepressants - bupropion  Failed - 08/10/2023  9:26 AM      Failed - Valid encounter within last 6 months    Recent Outpatient Visits           4 weeks ago Anxiety   [facility] Quinton Buckler, FNP   1 month ago Severe episode of recurrent major depressive disorder, without psychotic features [facility])   [place] [facility] Quinton Buckler, FNP       Future Appointments             In 3 months Abram Hoguet, Monalisa Angles, FNP [facility]   In 4 months Artemio Larry, MD [facility]            Passed - Cr in normal range and within 360 days    Creat  Date Value Ref Range Status  11/05/2022 0.79 0.50 - 0.97 mg/dL Final         Passed - AST in normal range and within 360 days    AST  Date Value Ref Range Status  11/05/2022 14 10 - 30 U/L Final         Passed - ALT in normal range and within 360 days    ALT  Date Value Ref Range Status  11/05/2022 18 6 - 29 U/L Final         Passed - Completed PHQ-2 or PHQ-9 in the last 360 days      Passed - Last BP in normal range    BP Readings from Last 1 Encounters:  07/13/23 124/76

## 2023-08-25 DIAGNOSIS — Z419 Encounter for procedure for purposes other than remedying health state, unspecified: Secondary | ICD-10-CM | POA: Diagnosis not present

## 2023-09-24 DIAGNOSIS — Z419 Encounter for procedure for purposes other than remedying health state, unspecified: Secondary | ICD-10-CM | POA: Diagnosis not present

## 2023-10-06 ENCOUNTER — Other Ambulatory Visit: Payer: Self-pay | Admitting: Nurse Practitioner

## 2023-10-06 DIAGNOSIS — G43009 Migraine without aura, not intractable, without status migrainosus: Secondary | ICD-10-CM

## 2023-10-07 NOTE — Telephone Encounter (Signed)
 Requested Prescriptions  Pending Prescriptions Disp Refills   propranolol  (INDERAL ) 80 MG tablet [Pharmacy Med Name: Propranolol  HCl 80 MG Oral Tablet] 90 tablet 1    Sig: Take 1 tablet by mouth once daily     Cardiovascular:  Beta Blockers Passed - 10/07/2023  1:53 PM      Passed - Last BP in normal range    BP Readings from Last 1 Encounters:  07/13/23 124/76         Passed - Last Heart Rate in normal range    Pulse Readings from Last 1 Encounters:  07/13/23 74         Passed - Valid encounter within last 6 months    Recent Outpatient Visits           2 months ago Anxiety   [facility] Gareth Clarity F, FNP   3 months ago Severe episode of recurrent major depressive disorder, without psychotic features [facility])   [facility] Gareth Clarity FALCON, FNP       Future Appointments             In 1 month Gareth, Clarity FALCON, FNP [facility]   In 2 months Jackquline Sawyer, MD [facility]

## 2023-10-25 DIAGNOSIS — Z419 Encounter for procedure for purposes other than remedying health state, unspecified: Secondary | ICD-10-CM | POA: Diagnosis not present

## 2023-10-28 ENCOUNTER — Encounter: Payer: Self-pay | Admitting: Nurse Practitioner

## 2023-10-28 ENCOUNTER — Other Ambulatory Visit: Payer: Self-pay | Admitting: Nurse Practitioner

## 2023-10-28 DIAGNOSIS — M419 Scoliosis, unspecified: Secondary | ICD-10-CM

## 2023-11-03 ENCOUNTER — Other Ambulatory Visit: Payer: Self-pay | Admitting: Nurse Practitioner

## 2023-11-03 DIAGNOSIS — F332 Major depressive disorder, recurrent severe without psychotic features: Secondary | ICD-10-CM

## 2023-11-03 DIAGNOSIS — F419 Anxiety disorder, unspecified: Secondary | ICD-10-CM

## 2023-11-04 ENCOUNTER — Other Ambulatory Visit: Payer: Self-pay | Admitting: Nurse Practitioner

## 2023-11-04 DIAGNOSIS — Z1231 Encounter for screening mammogram for malignant neoplasm of breast: Secondary | ICD-10-CM

## 2023-11-04 NOTE — Telephone Encounter (Signed)
 Requested by interface surescripts. Future visit in 4 days.  Requested Prescriptions  Pending Prescriptions Disp Refills   buPROPion  (WELLBUTRIN  XL) 150 MG 24 hr tablet [Pharmacy Med Name: buPROPion  HCl ER (XL) 150 MG Oral Tablet Extended Release 24 Hour] 90 tablet 0    Sig: Take 1 tablet by mouth once daily     Psychiatry: Antidepressants - bupropion  Failed - 11/04/2023 11:48 AM      Failed - Cr in normal range and within 360 days    Creat  Date Value Ref Range Status  11/05/2022 0.79 0.50 - 0.97 mg/dL Final         Failed - AST in normal range and within 360 days    AST  Date Value Ref Range Status  11/05/2022 14 10 - 30 U/L Final         Failed - ALT in normal range and within 360 days    ALT  Date Value Ref Range Status  11/05/2022 18 6 - 29 U/L Final         Passed - Completed PHQ-2 or PHQ-9 in the last 360 days      Passed - Last BP in normal range    BP Readings from Last 1 Encounters:  07/13/23 124/76         Passed - Valid encounter within last 6 months    Recent Outpatient Visits           3 months ago Anxiety   [facility] Gareth Mliss FALCON, FNP   4 months ago Severe episode of recurrent major depressive disorder, without psychotic features [facility])   [facility] Gareth Mliss FALCON, FNP       Future Appointments             In 4 days Gareth, Mliss FALCON, FNP [facility], [facility]   In 1 month Jackquline Sawyer, MD [facility]

## 2023-11-08 ENCOUNTER — Ambulatory Visit (INDEPENDENT_AMBULATORY_CARE_PROVIDER_SITE_OTHER): Payer: Self-pay | Admitting: Nurse Practitioner

## 2023-11-08 VITALS — BP 118/78 | HR 89 | Temp 98.1°F | Resp 18 | Ht 64.0 in | Wt 199.8 lb

## 2023-11-08 DIAGNOSIS — E782 Mixed hyperlipidemia: Secondary | ICD-10-CM | POA: Diagnosis not present

## 2023-11-08 DIAGNOSIS — Z130 Encounter for screening for diseases of the blood and blood-forming organs and certain disorders involving the immune mechanism: Secondary | ICD-10-CM | POA: Diagnosis not present

## 2023-11-08 DIAGNOSIS — E6609 Other obesity due to excess calories: Secondary | ICD-10-CM

## 2023-11-08 DIAGNOSIS — Z0000 Encounter for general adult medical examination without abnormal findings: Secondary | ICD-10-CM

## 2023-11-08 DIAGNOSIS — Z131 Encounter for screening for diabetes mellitus: Secondary | ICD-10-CM | POA: Diagnosis not present

## 2023-11-08 DIAGNOSIS — E66811 Obesity, class 1: Secondary | ICD-10-CM | POA: Diagnosis not present

## 2023-11-08 DIAGNOSIS — R7989 Other specified abnormal findings of blood chemistry: Secondary | ICD-10-CM | POA: Diagnosis not present

## 2023-11-08 DIAGNOSIS — Z6830 Body mass index (BMI) 30.0-30.9, adult: Secondary | ICD-10-CM | POA: Diagnosis not present

## 2023-11-08 NOTE — Progress Notes (Signed)
 Name: Candace Vaughn   MRN: [id number]    DOB: 25-Jan-1981   Date:11/08/2023       Progress Note  Subjective  Chief Complaint  Chief Complaint  Patient presents with   Annual Exam    HPI  Patient presents for annual CPE. Discussed the use of AI scribe software for clinical note transcription with the patient, who gave verbal consent to proceed.  History of Present Illness Candace Vaughn is a 43 year old female who presents for an annual physical exam.  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, despite not attending the gym recently - Weight stable at 199 pounds - Waist circumference is 41.5 inches  Sleep disturbance and fatigue - Variable sleep quality with some restful and some restless nights - Frequent nocturnal awakenings with difficulty returning to sleep - Persistent fatigue despite attempts to rest  Urinary stress incontinence - Occasional stress incontinence, particularly with sneezing or jumping - Performing Kegel exercises for management - No other issues with urinary incontinence  Mental health symptoms - Positive depression screening - Currently under treatment for depression - No violence at home  Preventive care and screening - Last dental exam approximately six months ago - Last eye exam one year ago - Mammogram scheduled for October    Diet: well balanced diet Exercise: very active with daily activities  Sleep: some good nights, and some bad nights ( tossing and turning) Last dental exam:scheduled Last eye exam: 1 year ago  [employer] Visit from 11/08/2023 in [facility]  AUDIT-C Score 0    Depression: Phq 9 is  positive    11/08/2023    8:17 AM 07/13/2023    7:52 AM 06/15/2023    8:20 AM 12/22/2022    8:11 AM 11/05/2022    9:09 AM  Depression screen PHQ 2/9  Decreased Interest 1 1 1  0 0  Down, Depressed, Hopeless 2 2 3 1  0  PHQ - 2 Score 3 3 4  1  0  Altered sleeping 2 1 2  2   Tired, decreased energy 2 2 3   0  Change in appetite 1 0 1  0  Feeling bad or failure about yourself  0 1 0  0  Trouble concentrating 0 2 2  0  Moving slowly or fidgety/restless 2 1 1   0  Suicidal thoughts 0 0 0  0  PHQ-9 Score 10 10 13  2   Difficult doing work/chores Not difficult at all Somewhat difficult Somewhat difficult  Not difficult at all   Hypertension: BP Readings from Last 3 Encounters:  11/08/23 118/78  07/13/23 124/76  06/15/23 116/66   Obesity: Wt Readings from Last 3 Encounters:  11/08/23 199 lb 12.8 oz (90.6 kg)  07/13/23 200 lb 8 oz (90.9 kg)  06/15/23 199 lb 8 oz (90.5 kg)   BMI Readings from Last 3 Encounters:  11/08/23 34.30 kg/m  07/13/23 35.52 kg/m  06/15/23 35.34 kg/m   Waist Measurement : 41.5 inches   Vaccines:  HPV: up to at age 79 , ask insurance if age between 70-45  Shingrix: 39-64 yo and ask insurance if covered when patient above 12 yo Pneumonia:  educated and discussed with patient. Flu:  educated and discussed with patient.  Hep C Screening: completed STD testing and prevention (HIV/chl/gon/syphilis): completed Intimate partner violence:none Sexual History :married Menstrual History/LMP/Abnormal Bleeding: hysterectomy Incontinence Symptoms: stress incontinence   Breast cancer:  -  Last Mammogram: 12/13/2022, already scheduled in October - BRCA gene screening: none  Osteoporosis: Discussed high calcium  and vitamin D supplementation, weight bearing exercises  Cervical cancer screening: hysterectomy  Skin cancer: Discussed monitoring for atypical lesions  Colorectal cancer: does not qualify   Lung cancer:   Low Dose CT Chest recommended if Age 54-80 years, 20 pack-year currently smoking OR have quit w/in 15years. Patient does not qualify.   ECG: none  Advanced Care Planning: A voluntary discussion about advance care planning including the explanation and discussion of advance directives.  Discussed  health care proxy and Living will, and the patient was able to identify a health care proxy as husband.  Patient does not have a living will at present time. If patient does have living will, I have requested they bring this to the clinic to be scanned in to their chart.  Lipids: Lab Results  Component Value Date   CHOL 146 11/05/2022   CHOL 207 (H) 11/17/2021   CHOL 159 11/24/2020   Lab Results  Component Value Date   HDL 51 11/05/2022   HDL 50 11/17/2021   HDL 53 11/24/2020   Lab Results  Component Value Date   LDLCALC 76 11/05/2022   LDLCALC 123 (H) 11/17/2021   LDLCALC 85 11/24/2020   Lab Results  Component Value Date   TRIG 104 11/05/2022   TRIG 224 (H) 11/17/2021   TRIG 116 11/24/2020   Lab Results  Component Value Date   CHOLHDL 2.9 11/05/2022   CHOLHDL 4.1 11/17/2021   CHOLHDL 3.0 11/24/2020   No results found for: LDLDIRECT  Glucose: Glucose  Date Value Ref Range Status  11/24/2020 96 65 - 99 mg/dL Final  90/91/7978 95 65 - 99 mg/dL Final  91/75/7979 87 65 - 99 mg/dL Final   Glucose, Bld  Date Value Ref Range Status  11/05/2022 101 (H) 65 - 99 mg/dL Final    Comment:    .            Fasting reference interval . For someone without known diabetes, a glucose value between 100 and 125 mg/dL is consistent with prediabetes and should be confirmed with a follow-up test. .   11/17/2021 97 65 - 99 mg/dL Final    Comment:    .            Fasting reference interval .     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    Past Surgical History:   Procedure Laterality Date   CHOLECYSTECTOMY, LAPAROSCOPIC  12/2009   COLPOSCOPY  09/26/2002   CIN2,HPV+,ECC NEG   SALPINGECTOMY  05/2014    Family History  Problem Relation Age of Onset   Hyperlipidemia Mother    Breast cancer Mother 49   Osteopenia Mother    Heart attack Mother        31s   Colon polyps Mother    COPD Father    Hypertension Father    Hyperlipidemia Sister 75   Dementia Maternal Grandmother        senile   Hyperlipidemia Maternal Grandmother    Leukemia Son  4 months   Breast cancer Maternal Aunt 29       has contact with her    Social History   Socioeconomic History   Marital status: Married    Spouse name: Not on file   Number of children: 2   Years of education: Not on file   Highest education level: 12th grade  Occupational History   Not on file  Tobacco Use   Smoking status: Never    Passive exposure: Never   Smokeless tobacco: Never  Vaping Use   Vaping status: Never Used  Substance and Sexual Activity   Alcohol use: Not Currently    Comment: occasionally   Drug use: Never   Sexual activity: Yes    Birth control/protection: Surgical    Comment: Hysterectomy  Other Topics Concern   Not on file  Social History Narrative   Not on file   Social Drivers of Health   Financial Resource Strain: Low Risk  (11/08/2023)   Overall Financial Resource Strain (CARDIA)    Difficulty of Paying Living Expenses: Not hard at all  Food Insecurity: No Food Insecurity (11/08/2023)   Hunger Vital Sign    Worried About Running Out of Food in the Last Year: Never true    Ran Out of Food in the Last Year: Never true  Transportation Needs: No Transportation Needs (11/08/2023)   PRAPARE - Administrator, Civil Service (Medical): No    Lack of Transportation (Non-Medical): No  Physical Activity: Sufficiently Active (11/08/2023)   Exercise Vital Sign    Days of Exercise per Week: 4 days    Minutes of Exercise per Session: 80 min  Stress:  Stress Concern Present (11/08/2023)   [employer] of Occupational Health - Occupational Stress Questionnaire    Feeling of Stress: Very much  Social Connections: Moderately Integrated (11/08/2023)   Social Connection and Isolation Panel    Frequency of Communication with Friends and Family: Three times a week    Frequency of Social Gatherings with Friends and Family: Twice a week    Attends Religious Services: More than 4 times per year    Active Member of [employer] or Organizations: No    Attends Engineer, structural: Not on file    Marital Status: Married  Catering manager Violence: Not At Risk (11/05/2022)   Humiliation, Afraid, Rape, and Kick questionnaire    Fear of Current or Ex-Partner: No    Emotionally Abused: No    Physically Abused: No    Sexually Abused: No     Current Outpatient Medications:    ALPRAZolam  (XANAX ) 0.25 MG tablet, Take 1 tablet (0.25 mg total) by mouth 2 (two) times daily as needed for anxiety., Disp: 60 tablet, Rfl: 0   atorvastatin  (LIPITOR) 20 MG tablet, Take 1 tablet by mouth once daily, Disp: 90 tablet, Rfl: 2   buPROPion  (WELLBUTRIN  XL) 150 MG 24 hr tablet, Take 1 tablet by mouth once daily, Disp: 90 tablet, Rfl: 0   busPIRone  (BUSPAR ) 7.5 MG tablet, Take 1 tablet (7.5 mg total) by mouth 3 (three) times daily., Disp: 270 tablet, Rfl: 2   famotidine  (PEPCID ) 20 MG tablet, Take 1 tablet (20 mg total) by mouth 2 (two) times daily., Disp: 60 tablet, Rfl: 2   propranolol  (INDERAL ) 80 MG tablet, Take 1 tablet by mouth once daily, Disp: 90 tablet, Rfl: 1   Safety Seal Miscellaneous MISC, Apply 1 Application topically daily. Medication name: Rosacea Extra Oxymetazoline 0.8%, Metronidazole 1% Nicotinamide 3%,  Ivermectin 1% Azelaic Acid 7.5%, Disp: 30 g, Rfl: 6   SUMAtriptan  (IMITREX ) 25 MG tablet, TAKE 2 TABLETS BY MOUTH EVERY 2 HOURS AS NEEDED FOR  MIGRAINE (ONGOING HEADACHE). MAX DAILY DOSE 200MG , Disp: 6 tablet, Rfl: 0  No Known  Allergies   ROS  Constitutional: Negative for fever or weight change.  Respiratory: Negative for cough and shortness of breath.   Cardiovascular: Negative for chest pain or palpitations.  Gastrointestinal: Negative for abdominal pain, no bowel changes.  Musculoskeletal: Negative for gait problem or joint swelling.  Skin: Negative for rash.  Neurological: Negative for dizziness or headache.  No other specific complaints in a complete review of systems (except as listed in HPI above).   Objective  Vitals:   11/08/23 0810  BP: 118/78  Pulse: 89  Resp: 18  Temp: 98.1 F (36.7 C)  SpO2: 98%  Weight: 199 lb 12.8 oz (90.6 kg)  Height: 5' 4 (1.626 m)    Body mass index is 34.3 kg/m.  Physical Exam Vitals reviewed.  Constitutional:      Appearance: Normal appearance.  HENT:     Head: Normocephalic.     Right Ear: Tympanic membrane normal.     Left Ear: Tympanic membrane normal.     Nose: Nose normal.  Eyes:     Extraocular Movements: Extraocular movements intact.     Conjunctiva/sclera: Conjunctivae normal.     Pupils: Pupils are equal, round, and reactive to light.  Neck:     Thyroid : No thyroid  mass, thyromegaly or thyroid  tenderness.  Cardiovascular:     Rate and Rhythm: Normal rate and regular rhythm.     Pulses: Normal pulses.     Heart sounds: Normal heart sounds.  Pulmonary:     Effort: Pulmonary effort is normal.     Breath sounds: Normal breath sounds.  Abdominal:     General: Bowel sounds are normal.     Palpations: Abdomen is soft.  Musculoskeletal:        General: Normal range of motion.     Cervical back: Normal range of motion and neck supple.     Right lower leg: No edema.     Left lower leg: No edema.  Skin:    General: Skin is warm and dry.     Capillary Refill: Capillary refill takes less than 2 seconds.  Neurological:     General: No focal deficit present.     Mental Status: She is alert and oriented to person, place, and time. Mental  status is at baseline.  Psychiatric:        Mood and Affect: Mood normal.        Behavior: Behavior normal.        Thought Content: Thought content normal.        Judgment: Judgment normal.      Fall Risk:    11/08/2023    8:11 AM 06/15/2023    8:02 AM 03/01/2023   11:25 AM 12/22/2022    8:11 AM 11/05/2022    9:00 AM  Fall Risk   Falls in the past year? 0 0 0 0 0  Number falls in past yr: 0 0  0 0  Injury with Fall? 0 0  0 0  Risk for fall due to :   No Fall Risks No Fall Risks   Follow up Falls evaluation completed Falls evaluation completed Falls prevention discussed Falls prevention discussed      Functional Status Survey: Is the patient deaf or  have difficulty hearing?: No Does the patient have difficulty seeing, even when wearing glasses/contacts?: No Does the patient have difficulty concentrating, remembering, or making decisions?: No Does the patient have difficulty walking or climbing stairs?: No Does the patient have difficulty dressing or bathing?: No Does the patient have difficulty doing errands alone such as visiting a doctor's office or shopping?: No   Assessment & Plan  Problem List Items Addressed This Visit       Other   Mixed hyperlipidemia   Relevant Orders   Lipid panel   Other Visit Diagnoses       Annual physical exam    -  Primary   Relevant Orders   CBC with Differential/Platelet   Comprehensive metabolic panel with GFR   Lipid panel   TSH   Hemoglobin A1c     Class 1 obesity due to excess calories with serious comorbidity and body mass index (BMI) of 30.0 to 30.9 in adult       Relevant Orders   TSH     Screening for deficiency anemia       Relevant Orders   CBC with Differential/Platelet     Screening for diabetes mellitus       Relevant Orders   Comprehensive metabolic panel with GFR   Hemoglobin A1c      Assessment and Plan Assessment & Plan Adult Wellness Visit Routine adult wellness visit with well-controlled blood  pressure at 118/78 mmHg. BMI is 34.3 with a weight of 199 pounds and waist circumference of 41.5 inches. Depression screening is positive, currently under treatment. No issues with violence at home. Last dental exam was six months ago, and last eye exam was a year ago. Scheduled for a mammogram in October. Colorectal cancer screening to be considered at age 24. Husband is healthcare proxy. - Perform routine blood work including cholesterol, A1c, and thyroid  function tests - Schedule mammogram for October - Maintain husband as healthcare proxy  Obesity Obesity with a BMI of 34.3. Weight is 199 pounds. 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 Sleep is variable with some nights of restlessness.  Stress urinary incontinence Experiences stress urinary incontinence, particularly with sneezing or jumping. - Continue Kegel exercises    -USPSTF grade A and B recommendations reviewed with patient; age-appropriate recommendations, preventive care, screening tests, etc discussed and encouraged; healthy living encouraged; see AVS for patient education given to patient -Discussed importance of 150 minutes of physical activity weekly, eat two servings of fish weekly, eat one serving of tree nuts ( cashews, pistachios, pecans, almonds.SABRA) every other day, eat 6 servings of fruit/vegetables daily and drink plenty of water and avoid sweet beverages.   -Reviewed Health Maintenance: yes

## 2023-11-09 ENCOUNTER — Other Ambulatory Visit: Payer: Self-pay | Admitting: Nurse Practitioner

## 2023-11-09 DIAGNOSIS — R7989 Other specified abnormal findings of blood chemistry: Secondary | ICD-10-CM

## 2023-11-09 LAB — COMPREHENSIVE METABOLIC PANEL WITH GFR
AG Ratio: 2.1 (calc) (ref 1.0–2.5)
ALT: 18 U/L (ref 6–29)
AST: 12 U/L (ref 10–30)
Albumin: 4.6 g/dL (ref 3.6–5.1)
Alkaline phosphatase (APISO): 97 U/L (ref 31–125)
BUN: 10 mg/dL (ref 7–25)
CO2: 26 mmol/L (ref 20–32)
Calcium: 9.6 mg/dL (ref 8.6–10.2)
Chloride: 106 mmol/L (ref 98–110)
Creat: 0.7 mg/dL (ref 0.50–0.99)
Globulin: 2.2 g/dL (ref 1.9–3.7)
Glucose, Bld: 99 mg/dL (ref 65–99)
Potassium: 4.7 mmol/L (ref 3.5–5.3)
Sodium: 138 mmol/L (ref 135–146)
Total Bilirubin: 0.3 mg/dL (ref 0.2–1.2)
Total Protein: 6.8 g/dL (ref 6.1–8.1)
eGFR: 112 mL/min/1.73m2 (ref >=60)

## 2023-11-09 LAB — LIPID PANEL
Cholesterol: 197 mg/dL (ref <200)
HDL: 54 mg/dL (ref >=50)
LDL Cholesterol (Calc): 118 mg/dL — ABNORMAL HIGH
Non-HDL Cholesterol (Calc): 143 mg/dL — ABNORMAL HIGH (ref <130)
Total CHOL/HDL Ratio: 3.6 (calc) (ref <5.0)
Triglycerides: 132 mg/dL (ref <150)

## 2023-11-09 LAB — TEST AUTHORIZATION

## 2023-11-09 LAB — CBC WITH DIFFERENTIAL/PLATELET
Absolute Lymphocytes: 2058 {cells}/uL (ref 850–3900)
Absolute Monocytes: 288 {cells}/uL (ref 200–950)
Basophils Absolute: 42 {cells}/uL (ref 0–200)
Basophils Relative: 0.7 %
Eosinophils Absolute: 60 {cells}/uL (ref 15–500)
Eosinophils Relative: 1 %
HCT: 44.5 % (ref 35.0–45.0)
Hemoglobin: 14.7 g/dL (ref 11.7–15.5)
MCH: 29.9 pg (ref 27.0–33.0)
MCHC: 33 g/dL (ref 32.0–36.0)
MCV: 90.6 fL (ref 80.0–100.0)
MPV: 11.4 fL (ref 7.5–12.5)
Monocytes Relative: 4.8 %
Neutro Abs: 3552 {cells}/uL (ref 1500–7800)
Neutrophils Relative %: 59.2 %
Platelets: 224 Thousand/uL (ref 140–400)
RBC: 4.91 Million/uL (ref 3.80–5.10)
RDW: 12.3 % (ref 11.0–15.0)
Total Lymphocyte: 34.3 %
WBC: 6 Thousand/uL (ref 3.8–10.8)

## 2023-11-09 LAB — T3, FREE: T3, Free: 3.2 pg/mL (ref 2.3–4.2)

## 2023-11-09 LAB — HEMOGLOBIN A1C
Hgb A1c MFr Bld: 5.6 % (ref <5.7)
Mean Plasma Glucose: 114 mg/dL
eAG (mmol/L): 6.3 mmol/L

## 2023-11-09 LAB — T4, FREE: Free T4: 1.2 ng/dL (ref 0.8–1.8)

## 2023-11-09 LAB — TSH: TSH: 0.33 m[IU]/L — ABNORMAL LOW

## 2023-11-10 ENCOUNTER — Ambulatory Visit: Payer: Self-pay | Admitting: Nurse Practitioner

## 2023-11-10 DIAGNOSIS — R7989 Other specified abnormal findings of blood chemistry: Secondary | ICD-10-CM

## 2023-11-17 DIAGNOSIS — M546 Pain in thoracic spine: Secondary | ICD-10-CM | POA: Diagnosis not present

## 2023-11-17 DIAGNOSIS — M9904 Segmental and somatic dysfunction of sacral region: Secondary | ICD-10-CM | POA: Diagnosis not present

## 2023-11-17 DIAGNOSIS — M9901 Segmental and somatic dysfunction of cervical region: Secondary | ICD-10-CM | POA: Diagnosis not present

## 2023-11-17 DIAGNOSIS — M5412 Radiculopathy, cervical region: Secondary | ICD-10-CM | POA: Diagnosis not present

## 2023-11-17 DIAGNOSIS — M9902 Segmental and somatic dysfunction of thoracic region: Secondary | ICD-10-CM | POA: Diagnosis not present

## 2023-11-17 DIAGNOSIS — M5416 Radiculopathy, lumbar region: Secondary | ICD-10-CM | POA: Diagnosis not present

## 2023-11-17 DIAGNOSIS — M62830 Muscle spasm of back: Secondary | ICD-10-CM | POA: Diagnosis not present

## 2023-11-17 DIAGNOSIS — M5418 Radiculopathy, sacral and sacrococcygeal region: Secondary | ICD-10-CM | POA: Diagnosis not present

## 2023-11-17 DIAGNOSIS — M9903 Segmental and somatic dysfunction of lumbar region: Secondary | ICD-10-CM | POA: Diagnosis not present

## 2023-11-18 DIAGNOSIS — M5412 Radiculopathy, cervical region: Secondary | ICD-10-CM | POA: Diagnosis not present

## 2023-11-18 DIAGNOSIS — M62830 Muscle spasm of back: Secondary | ICD-10-CM | POA: Diagnosis not present

## 2023-11-18 DIAGNOSIS — M5416 Radiculopathy, lumbar region: Secondary | ICD-10-CM | POA: Diagnosis not present

## 2023-11-18 DIAGNOSIS — M9904 Segmental and somatic dysfunction of sacral region: Secondary | ICD-10-CM | POA: Diagnosis not present

## 2023-11-18 DIAGNOSIS — M9902 Segmental and somatic dysfunction of thoracic region: Secondary | ICD-10-CM | POA: Diagnosis not present

## 2023-11-18 DIAGNOSIS — M9903 Segmental and somatic dysfunction of lumbar region: Secondary | ICD-10-CM | POA: Diagnosis not present

## 2023-11-18 DIAGNOSIS — M9901 Segmental and somatic dysfunction of cervical region: Secondary | ICD-10-CM | POA: Diagnosis not present

## 2023-11-18 DIAGNOSIS — M546 Pain in thoracic spine: Secondary | ICD-10-CM | POA: Diagnosis not present

## 2023-11-18 DIAGNOSIS — M5418 Radiculopathy, sacral and sacrococcygeal region: Secondary | ICD-10-CM | POA: Diagnosis not present

## 2023-11-24 ENCOUNTER — Encounter: Payer: Self-pay | Admitting: Dermatology

## 2023-11-24 ENCOUNTER — Ambulatory Visit: Admitting: Dermatology

## 2023-11-24 DIAGNOSIS — L719 Rosacea, unspecified: Secondary | ICD-10-CM

## 2023-11-24 MED ORDER — SAFETY SEAL MISCELLANEOUS MISC: 1.0000 | Freq: Every day | 10 refills | Status: AC

## 2023-11-24 NOTE — Patient Instructions (Addendum)
 Date: Thu Nov 24 2023  Hello Tammey,  Thank you for visiting today. Here is a summary of the key instructions:  - Medications:   - Continue using the Rosacea Compound once a day in the morning   - Start using Avene retinol product every night   - If skin becomes dry or peely, reduce to every other night  - Treatment Areas:   - Try the Avene retinol sample before buying   - Plan to advance to prescription retinoid in April/May  - Follow-up:   - Next appointment in April/May   - Contact the office if any issues arise before then  - Other Instructions:   - View detailed instructions online in your patient portal  Please reach out if you have any questions or concerns.  Warm regards,  Dr. Delon [facility]is [facility]    Important Information   Due to recent changes in healthcare laws, you may see results of your pathology and/or laboratory studies on MyChart before the doctors have had a chance to review them. We understand that in some cases there may be results that are confusing or concerning to you. Please understand that not all results are received at the same time and often the doctors may need to interpret multiple results in order to provide you with the best plan of care or course of treatment. Therefore, we ask that you please give us  2 business days to thoroughly review all your results before contacting the office for clarification. Should we see a critical lab result, you will be contacted sooner.     If You Need Anything After Your Visit   If you have any questions or concerns for your doctor, please call our main line at [phone]. If no one answers, please leave a voicemail as directed and we will return your call as soon as possible. Messages left after 4 pm will be answered the following business day.    You may also send us  a message via MyChart. We typically respond to MyChart messages within 1-2 business days.  For prescription refills, please ask your  pharmacy to contact our office. Our fax number is [phone].  If you have an urgent issue when the clinic is closed that cannot wait until the next business day, you can page your doctor at the number below.     Please note that while we do our best to be available for urgent issues outside of office hours, we are not available 24/7.    If you have an urgent issue and are unable to reach us , you may choose to seek medical care at your doctor's office, retail clinic, urgent care center, or emergency room.   If you have a medical emergency, please immediately call 911 or go to the emergency department. In the event of inclement weather, please call our main line at [phone] for an update on the status of any delays or closures.  Dermatology Medication Tips: Please keep the boxes that topical medications come in in order to help keep track of the instructions about where and how to use these. Pharmacies typically print the medication instructions only on the boxes and not directly on the medication tubes.   If your medication is too expensive, please contact our office at [phone] or send us  a message through MyChart.    We are unable to tell what your co-pay for medications will be in advance as this is different depending on your insurance coverage. However, we may be  able to find a substitute medication at lower cost or fill out paperwork to get insurance to cover a needed medication.    If a prior authorization is required to get your medication covered by your insurance company, please allow us  1-2 business days to complete this process.   Drug prices often vary depending on where the prescription is filled and some pharmacies may offer cheaper prices.   The website www.goodrx.com contains coupons for medications through different pharmacies. The prices here do not account for what the cost may be with help from insurance (it may be cheaper with your insurance), but the website can  give you the price if you did not use any insurance.  - You can print the associated coupon and take it with your prescription to the pharmacy.  - You may also stop by our office during regular business hours and pick up a GoodRx coupon card.  - If you need your prescription sent electronically to a different pharmacy, notify our office through [facility] or by phone at [phone]

## 2023-11-24 NOTE — Progress Notes (Signed)
   Follow-Up Visit   Subjective  Candace Vaughn is a 43 y.o. female who presents for the following: Rosacea  Patient present today for follow up visit for Rosacea. Patient was last evaluated on 06/07/23. At this visit patient was prescribed Metro cream,Rosacea Extra from [facility] that contains Oxymetazoline 0.8%, Metronidazole 1% Nicotinamide 3%, Ivermectin 1% Azelaic Acid 7.5% (Once daily). She was also recommended to apply LRP Redness relief cream. Patient reports sxs are improving. Patient denies medication changes.  The following portions of the chart were reviewed this encounter and updated as appropriate: medications, allergies, medical history  Review of Systems:  No other skin or systemic complaints except as noted in HPI or Assessment and Plan.  Objective  Well appearing patient in no apparent distress; mood and affect are within normal limits.  A focused examination was performed of the following areas: Face  Relevant exam findings are noted in the Assessment and Plan.           Assessment & Plan   1. Rosacea (Improved) - Assessment: Patient's rosacea is showing improvement with current treatment regimen. Underlying redness, especially in the forehead area, is slowly fading. However, treatment goals have not yet been fully achieved. The patient has been compliant with the prescribed Rosacea Compound containing oxymetazoline, metronidazole, ivermectin, and azelaic acid, applying it once daily in the morning  - Plan:    Continue Rosacea Compound (containing oxymetazoline, metronidazole, ivermectin, and azelaic acid) once daily in the morning    Add Avene retinol product (over-the-counter) to nightly skincare routine    Patient instructed to try sample before purchasing    If skin becomes dry or peeling, reduce application to every other night    Refill Rosacea Compound prescription with 5 refills    Patient advised to contact office if any issues arise before next  appointment  Follow-up appointment scheduled for April/May to assess progress and consider advancing to prescription-strength retinoid.  ROSACEA   Related Medications Safety Seal Miscellaneous MISC Apply 1 Application topically daily. Medication name: Rosacea Extra Oxymetazoline 0.8%, Metronidazole 1% Nicotinamide 3%, Ivermectin 1% Azelaic Acid 7.5%  Return in about 8 months (around 07/23/2024) for Rosacea F/U.  I, Jetta Ager, am acting as Neurosurgeon for [employer], DO.  Documentation: I have reviewed the above documentation for accuracy and completeness, and I agree with the above.  Delon Lenis, DO

## 2023-11-25 DIAGNOSIS — Z419 Encounter for procedure for purposes other than remedying health state, unspecified: Secondary | ICD-10-CM | POA: Diagnosis not present

## 2023-12-12 ENCOUNTER — Ambulatory Visit: Admitting: Obstetrics and Gynecology

## 2023-12-14 ENCOUNTER — Ambulatory Visit
Admission: RE | Admit: 2023-12-14 | Discharge: 2023-12-14 | Disposition: A | Discharge status: Home / Self Care | Source: Ambulatory Visit | Attending: Nurse Practitioner | Admitting: Nurse Practitioner

## 2023-12-14 ENCOUNTER — Encounter: Payer: Self-pay | Admitting: Dermatology

## 2023-12-14 DIAGNOSIS — M62830 Muscle spasm of back: Secondary | ICD-10-CM | POA: Diagnosis not present

## 2023-12-14 DIAGNOSIS — M5412 Radiculopathy, cervical region: Secondary | ICD-10-CM | POA: Diagnosis not present

## 2023-12-14 DIAGNOSIS — M9902 Segmental and somatic dysfunction of thoracic region: Secondary | ICD-10-CM | POA: Diagnosis not present

## 2023-12-14 DIAGNOSIS — M5416 Radiculopathy, lumbar region: Secondary | ICD-10-CM | POA: Diagnosis not present

## 2023-12-14 DIAGNOSIS — M9901 Segmental and somatic dysfunction of cervical region: Secondary | ICD-10-CM | POA: Diagnosis not present

## 2023-12-14 DIAGNOSIS — M9903 Segmental and somatic dysfunction of lumbar region: Secondary | ICD-10-CM | POA: Diagnosis not present

## 2023-12-14 DIAGNOSIS — M5418 Radiculopathy, sacral and sacrococcygeal region: Secondary | ICD-10-CM | POA: Diagnosis not present

## 2023-12-14 DIAGNOSIS — M546 Pain in thoracic spine: Secondary | ICD-10-CM | POA: Diagnosis not present

## 2023-12-14 DIAGNOSIS — Z1231 Encounter for screening mammogram for malignant neoplasm of breast: Secondary | ICD-10-CM | POA: Diagnosis not present

## 2023-12-14 DIAGNOSIS — M9904 Segmental and somatic dysfunction of sacral region: Secondary | ICD-10-CM | POA: Diagnosis not present

## 2023-12-15 ENCOUNTER — Encounter: Payer: Self-pay | Admitting: Obstetrics and Gynecology

## 2023-12-15 ENCOUNTER — Ambulatory Visit: Admitting: Obstetrics and Gynecology

## 2023-12-15 ENCOUNTER — Other Ambulatory Visit: Payer: Self-pay | Admitting: Medical Genetics

## 2023-12-15 VITALS — BP 93/64 | HR 65 | Ht 64.0 in | Wt 202.0 lb

## 2023-12-15 DIAGNOSIS — Z01419 Encounter for gynecological examination (general) (routine) without abnormal findings: Secondary | ICD-10-CM

## 2023-12-15 DIAGNOSIS — Z90710 Acquired absence of both cervix and uterus: Secondary | ICD-10-CM | POA: Diagnosis not present

## 2023-12-15 DIAGNOSIS — D069 Carcinoma in situ of cervix, unspecified: Secondary | ICD-10-CM | POA: Diagnosis not present

## 2023-12-15 DIAGNOSIS — Z1231 Encounter for screening mammogram for malignant neoplasm of breast: Secondary | ICD-10-CM

## 2023-12-15 NOTE — Patient Instructions (Signed)
 I value your feedback and you entrusting [place] with your care. If you get a [place] patient survey, I would appreciate you taking the time to let us know about your experience today. Thank you! ? ? ?

## 2023-12-15 NOTE — Progress Notes (Signed)
 PCP:  Gareth Mliss FALCON, FNP   Chief Complaint  Patient presents with   Gynecologic Exam    No concerns    HPI:      [name] is a 43 y.o. No obstetric history on file. who LMP was No LMP recorded. Patient has had a hysterectomy., presents today for her annual examination.  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  She does not have PMB/pelvic pain. No VS sx.   Sex activity: single partner, contraception - post menopausal status. No pain/bleeding/dryness. Last Pap: 12/09/22, 11/21/19 and 11/07/18  Results were: neg cells, neg HPV DNA.  2019 With ASCUS with neg HPV DNA. Paps due Q3 yrs for 25 yrs per [facility]. 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 RT breast cycst on 10/20 mammo and u/s. Had mammo yesterday with PCP and still awaiting results.  There is a FH of breast cancer in her mom and mat grt aunt, genetic testing not indicated for pt. There is no FH of ovarian cancer. The patient does do self-breast exams.  Tobacco use: The patient denies current or previous tobacco use. Alcohol use: none No drug use.  Exercise: mod active  She does get adequate calcium  and Vitamin D in her diet.  Rxs and med problems managed by PCP.   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   Dysmenorrhea    mild   Esophageal reflux    H/O: hysterectomy 06/13/2014   CP JR; TLH/BS   History of abnormal cervical Pap smear 12/02/2014   ASCUS/neg-needs yearly paps for several yrs due to AIS   Hyperlipidemia    started on lipitor 3/14;recheck in 5/14   Postpartum depression    Thyromegaly 2016   no nodules, normal thryroid labs    Past Surgical History:  Procedure Laterality Date    CHOLECYSTECTOMY, LAPAROSCOPIC  12/2009   COLPOSCOPY  09/26/2002   CIN2,HPV+,ECC NEG   SALPINGECTOMY  05/2014    Family History  Problem Relation Age of Onset   Hyperlipidemia Mother    Breast cancer Mother 7   Osteopenia Mother    Heart attack Mother        13s   Colon polyps Mother    COPD Father    Hypertension Father    Hyperlipidemia Sister 7   Dementia Maternal Grandmother        senile   Hyperlipidemia Maternal Grandmother    Leukemia Son        4 months   Breast cancer Maternal Aunt 55       has contact with her    Social History   Socioeconomic History   Marital status: Married    Spouse name: Not on file   Number of children: 2   Years of education: Not on file   Highest education level: 12th grade  Occupational History   Not on file  Tobacco Use   Smoking status: Never    Passive exposure: Never   Smokeless tobacco: Never  Vaping Use   Vaping status: Never Used  Substance and Sexual Activity   Alcohol use: Not Currently   Drug use:  Never   Sexual activity: Yes    Birth control/protection: Surgical    Comment: Hysterectomy  Other Topics Concern   Not on file  Social History Narrative   Not on file   Social Drivers of Health   Financial Resource Strain: Low Risk  (11/08/2023)   Overall Financial Resource Strain (CARDIA)    Difficulty of Paying Living Expenses: Not hard at all  Food Insecurity: No Food Insecurity (11/08/2023)   Hunger Vital Sign    Worried About Running Out of Food in the Last Year: Never true    Ran Out of Food in the Last Year: Never true  Transportation Needs: No Transportation Needs (11/08/2023)   PRAPARE - Administrator, Civil Service (Medical): No    Lack of Transportation (Non-Medical): No  Physical Activity: Sufficiently Active (11/08/2023)   Exercise Vital Sign    Days of Exercise per Week: 4 days    Minutes of Exercise per Session: 80 min  Stress: Stress Concern Present (11/08/2023)   [employer]  of Occupational Health - Occupational Stress Questionnaire    Feeling of Stress: Very much  Social Connections: Moderately Integrated (11/08/2023)   Social Connection and Isolation Panel    Frequency of Communication with Friends and Family: Three times a week    Frequency of Social Gatherings with Friends and Family: Twice a week    Attends Religious Services: More than 4 times per year    Active Member of [employer] or Organizations: No    Attends Engineer, structural: Not on file    Marital Status: Married  Catering manager Violence: Not At Risk (11/05/2022)   Humiliation, Afraid, Rape, and Kick questionnaire    Fear of Current or Ex-Partner: No    Emotionally Abused: No    Physically Abused: No    Sexually Abused: No    Outpatient Medications Prior to Visit  Medication Sig Dispense Refill   ALPRAZolam  (XANAX ) 0.25 MG tablet Take 1 tablet (0.25 mg total) by mouth 2 (two) times daily as needed for anxiety. 60 tablet 0   atorvastatin  (LIPITOR) 20 MG tablet Take 1 tablet by mouth once daily 90 tablet 2   buPROPion  (WELLBUTRIN  XL) 150 MG 24 hr tablet Take 1 tablet by mouth once daily 90 tablet 0   busPIRone  (BUSPAR ) 7.5 MG tablet Take 1 tablet (7.5 mg total) by mouth 3 (three) times daily. 270 tablet 2   propranolol  (INDERAL ) 80 MG tablet Take 1 tablet by mouth once daily 90 tablet 1   Safety Seal Miscellaneous MISC Apply 1 Application topically daily. Medication name: Rosacea Extra Oxymetazoline 0.8%, Metronidazole 1% Nicotinamide 3%, Ivermectin 1% Azelaic Acid 7.5% 30 g 10   SUMAtriptan  (IMITREX ) 25 MG tablet TAKE 2 TABLETS BY MOUTH EVERY 2 HOURS AS NEEDED FOR  MIGRAINE (ONGOING HEADACHE). MAX DAILY DOSE 200MG  6 tablet 0   No facility-administered medications prior to visit.      ROS:  Review of Systems  Constitutional:  Negative for fatigue, fever and unexpected weight change.  Respiratory:  Negative for cough, shortness of breath and wheezing.   Cardiovascular:  Negative  for chest pain, palpitations and leg swelling.  Gastrointestinal:  Negative for anal bleeding, blood in stool, constipation, diarrhea, nausea and vomiting.  Endocrine: Negative for cold intolerance, heat intolerance and polyuria.  Genitourinary:  Negative for dyspareunia, dysuria, flank pain, frequency, genital sores, hematuria, menstrual problem, pelvic pain, urgency, vaginal bleeding, vaginal discharge and vaginal pain.  Musculoskeletal:  Negative for arthralgias, back  pain, joint swelling and myalgias.  Skin:  Negative for rash.  Neurological:  Negative for dizziness, syncope, light-headedness, numbness and headaches.  Hematological:  Negative for adenopathy.  Psychiatric/Behavioral:  Negative for agitation, confusion, sleep disturbance and suicidal ideas. The patient is not nervous/anxious.      Objective: BP 93/64   Pulse 65   Ht 5' 4 (1.626 m)   Wt 202 lb (91.6 kg)   BMI 34.67 kg/m    Physical Exam Constitutional:      Appearance: She is well-developed.  Genitourinary:     Vulva normal.     Genitourinary Comments: UTERUS/CX SURG REM     Right Labia: No rash, tenderness or lesions.    Left Labia: No tenderness, lesions or rash.    Vaginal cuff intact.    No vaginal discharge, erythema or tenderness.      Right Adnexa: not tender and no mass present.    Left Adnexa: not tender and no mass present.    Cervix is absent.     Uterus is absent.  Breasts:    Right: No mass, nipple discharge, skin change or tenderness.     Left: No mass, nipple discharge, skin change or tenderness.  Neck:     Thyroid : No thyromegaly.  Cardiovascular:     Rate and Rhythm: Normal rate and regular rhythm.     Heart sounds: Normal heart sounds. No murmur heard. Pulmonary:     Effort: Pulmonary effort is normal.     Breath sounds: Normal breath sounds.  Abdominal:     Palpations: Abdomen is soft.     Tenderness: There is no abdominal tenderness. There is no guarding.  Musculoskeletal:         General: Normal range of motion.     Cervical back: Normal range of motion.  Neurological:     General: No focal deficit present.     Mental Status: She is alert and oriented to person, place, and time.     Cranial Nerves: No cranial nerve deficit.  Skin:    General: Skin is warm and dry.  Psychiatric:        Mood and Affect: Mood normal.        Behavior: Behavior normal.        Thought Content: Thought content normal.        Judgment: Judgment normal.  Vitals reviewed.     Assessment/Plan: Encounter for annual routine gynecological examination  Adenocarcinoma in situ (AIS) of uterine cervix; paps due Q3 yrs for 25 yrs  Encounter for screening mammogram for malignant neoplasm of breast; pt current on mammo        GYN counsel breast self exam, adequate intake of calcium  and vitamin D, diet and exercise     F/U  Return in about 1 year (around 12/14/2024).  Rodolphe Edmonston B. Reyaan Thoma, PA-C 12/15/2023 1:51 PM

## 2023-12-19 DIAGNOSIS — H5213 Myopia, bilateral: Secondary | ICD-10-CM | POA: Diagnosis not present

## 2023-12-22 DIAGNOSIS — R7989 Other specified abnormal findings of blood chemistry: Secondary | ICD-10-CM | POA: Diagnosis not present

## 2023-12-23 ENCOUNTER — Other Ambulatory Visit: Payer: Self-pay | Admitting: Nurse Practitioner

## 2023-12-23 DIAGNOSIS — E0590 Thyrotoxicosis, unspecified without thyrotoxic crisis or storm: Secondary | ICD-10-CM

## 2023-12-23 LAB — THYROID PANEL WITH TSH
Free Thyroxine Index: 2.1 (ref 1.4–3.8)
T3 Uptake: 23 % (ref 22–35)
T4, Total: 9.2 ug/dL (ref 5.1–11.9)
TSH: 0.14 m[IU]/L — ABNORMAL LOW

## 2023-12-28 DIAGNOSIS — M9901 Segmental and somatic dysfunction of cervical region: Secondary | ICD-10-CM | POA: Diagnosis not present

## 2023-12-28 DIAGNOSIS — M9902 Segmental and somatic dysfunction of thoracic region: Secondary | ICD-10-CM | POA: Diagnosis not present

## 2023-12-28 DIAGNOSIS — M9903 Segmental and somatic dysfunction of lumbar region: Secondary | ICD-10-CM | POA: Diagnosis not present

## 2023-12-28 DIAGNOSIS — M5418 Radiculopathy, sacral and sacrococcygeal region: Secondary | ICD-10-CM | POA: Diagnosis not present

## 2023-12-28 DIAGNOSIS — M5416 Radiculopathy, lumbar region: Secondary | ICD-10-CM | POA: Diagnosis not present

## 2023-12-28 DIAGNOSIS — M9904 Segmental and somatic dysfunction of sacral region: Secondary | ICD-10-CM | POA: Diagnosis not present

## 2023-12-28 DIAGNOSIS — M62830 Muscle spasm of back: Secondary | ICD-10-CM | POA: Diagnosis not present

## 2023-12-28 DIAGNOSIS — M546 Pain in thoracic spine: Secondary | ICD-10-CM | POA: Diagnosis not present

## 2023-12-28 DIAGNOSIS — M5412 Radiculopathy, cervical region: Secondary | ICD-10-CM | POA: Diagnosis not present

## 2024-01-02 ENCOUNTER — Ambulatory Visit: Payer: Medicaid Other | Admitting: Dermatology

## 2024-01-02 ENCOUNTER — Encounter: Payer: Self-pay | Admitting: Dermatology

## 2024-01-02 DIAGNOSIS — L578 Other skin changes due to chronic exposure to nonionizing radiation: Secondary | ICD-10-CM | POA: Diagnosis not present

## 2024-01-02 DIAGNOSIS — D1801 Hemangioma of skin and subcutaneous tissue: Secondary | ICD-10-CM

## 2024-01-02 DIAGNOSIS — D229 Melanocytic nevi, unspecified: Secondary | ICD-10-CM

## 2024-01-02 DIAGNOSIS — D2262 Melanocytic nevi of left upper limb, including shoulder: Secondary | ICD-10-CM | POA: Diagnosis not present

## 2024-01-02 DIAGNOSIS — D224 Melanocytic nevi of scalp and neck: Secondary | ICD-10-CM | POA: Diagnosis not present

## 2024-01-02 DIAGNOSIS — W908XXA Exposure to other nonionizing radiation, initial encounter: Secondary | ICD-10-CM | POA: Diagnosis not present

## 2024-01-02 DIAGNOSIS — D225 Melanocytic nevi of trunk: Secondary | ICD-10-CM | POA: Diagnosis not present

## 2024-01-02 DIAGNOSIS — Z1283 Encounter for screening for malignant neoplasm of skin: Secondary | ICD-10-CM

## 2024-01-02 DIAGNOSIS — Z808 Family history of malignant neoplasm of other organs or systems: Secondary | ICD-10-CM

## 2024-01-02 DIAGNOSIS — L83 Acanthosis nigricans: Secondary | ICD-10-CM | POA: Diagnosis not present

## 2024-01-02 DIAGNOSIS — L853 Xerosis cutis: Secondary | ICD-10-CM | POA: Diagnosis not present

## 2024-01-02 DIAGNOSIS — D492 Neoplasm of unspecified behavior of bone, soft tissue, and skin: Secondary | ICD-10-CM | POA: Diagnosis not present

## 2024-01-02 DIAGNOSIS — L821 Other seborrheic keratosis: Secondary | ICD-10-CM

## 2024-01-02 DIAGNOSIS — L814 Other melanin hyperpigmentation: Secondary | ICD-10-CM

## 2024-01-02 DIAGNOSIS — L719 Rosacea, unspecified: Secondary | ICD-10-CM | POA: Diagnosis not present

## 2024-01-02 NOTE — Patient Instructions (Addendum)
 Wound Care Instructions  Cleanse wound gently with soap and water once a day then pat dry with clean gauze. Apply a thin coat of Petrolatum (petroleum jelly, Vaseline) over the wound (unless you have an allergy to this). We recommend that you use a new, sterile tube of Vaseline. Do not pick or remove scabs. Do not remove the yellow or white healing tissue from the base of the wound.  Cover the wound with fresh, clean, nonstick gauze and secure with paper tape. You may use Band-Aids in place of gauze and tape if the wound is small enough, but would recommend trimming much of the tape off as there is often too much. Sometimes Band-Aids can irritate the skin.  You should call the office for your biopsy report after 1 week if you have not already been contacted.  If you experience any problems, such as abnormal amounts of bleeding, swelling, significant bruising, significant pain, or evidence of infection, please call the office immediately.  FOR ADULT SURGERY PATIENTS: If you need something for pain relief you may take 1 extra strength Tylenol  (acetaminophen ) AND 2 Ibuprofen  (200mg  each) together every 4 hours as needed for pain. (do not take these if you are allergic to them or if you have a reason you should not take them.) Typically, you may only need pain medication for 1 to 3 days.     Gentle Skin Care Guide  1. Bathe no more than once a day.  2. Avoid bathing in hot water  3. Use a mild soap like Dove, Vanicream, Cetaphil, CeraVe. Can use Lever 2000 or Cetaphil antibacterial soap  4. Use soap only where you need it. On most days, use it under your arms, between your legs, and on your feet. Let the water rinse other areas unless visibly dirty.  5. When you get out of the bath/shower, use a towel to gently blot your skin dry, don't rub it.  6. While your skin is still a little damp, apply a moisturizing cream such as Vanicream, CeraVe, Cetaphil, Eucerin, Sarna lotion or plain Vaseline  Jelly. For hands apply Neutrogena [place] Hand Cream or Excipial Hand Cream.  7. Reapply moisturizer any time you start to itch or feel dry.  8. Sometimes using free and clear laundry detergents can be helpful. Fabric softener sheets should be avoided. Downy Free & Gentle liquid, or any liquid fabric softener that is free of dyes and perfumes, it acceptable to use  9. If your doctor has given you prescription creams you may apply moisturizers over them     Due to recent changes in healthcare laws, you may see results of your pathology and/or laboratory studies on MyChart before the doctors have had a chance to review them. We understand that in some cases there may be results that are confusing or concerning to you. Please understand that not all results are received at the same time and often the doctors may need to interpret multiple results in order to provide you with the best plan of care or course of treatment. Therefore, we ask that you please give us  2 business days to thoroughly review all your results before contacting the office for clarification. Should we see a critical lab result, you will be contacted sooner.   If You Need Anything After Your Visit  If you have any questions or concerns for your doctor, please call our main line at [phone] and press option 4 to reach your doctor's medical assistant. If no one answers,  please leave a voicemail as directed and we will return your call as soon as possible. Messages left after 4 pm will be answered the following business day.   You may also send us  a message via MyChart. We typically respond to MyChart messages within 1-2 business days.  For prescription refills, please ask your pharmacy to contact our office. Our fax number is [phone].  If you have an urgent issue when the clinic is closed that cannot wait until the next business day, you can page your doctor at the number below.    Please note that while we do our best  to be available for urgent issues outside of office hours, we are not available 24/7.   If you have an urgent issue and are unable to reach us , you may choose to seek medical care at your doctor's office, retail clinic, urgent care center, or emergency room.  If you have a medical emergency, please immediately call 911 or go to the emergency department.  Pager Numbers  - Dr. Hester: [phone]  - Dr. Jackquline: [phone]  - Dr. Claudene: [phone]   - Dr. Raymund: [phone]  In the event of inclement weather, please call our main line at [phone] for an update on the status of any delays or closures.  Dermatology Medication Tips: Please keep the boxes that topical medications come in in order to help keep track of the instructions about where and how to use these. Pharmacies typically print the medication instructions only on the boxes and not directly on the medication tubes.   If your medication is too expensive, please contact our office at [phone] option 4 or send us  a message through MyChart.   We are unable to tell what your co-pay for medications will be in advance as this is different depending on your insurance coverage. However, we may be able to find a substitute medication at lower cost or fill out paperwork to get insurance to cover a needed medication.   If a prior authorization is required to get your medication covered by your insurance company, please allow us  1-2 business days to complete this process.  Drug prices often vary depending on where the prescription is filled and some pharmacies may offer cheaper prices.  The website www.goodrx.com contains coupons for medications through different pharmacies. The prices here do not account for what the cost may be with help from insurance (it may be cheaper with your insurance), but the website can give you the price if you did not use any insurance.  - You can print the associated coupon and take it with  your prescription to the pharmacy.  - You may also stop by our office during regular business hours and pick up a GoodRx coupon card.  - If you need your prescription sent electronically to a different pharmacy, notify our office through [facility] or by phone at [phone] option 4.     Si Usted Necesita Algo Despus de Su Visita  Tambin puede enviarnos un mensaje a travs de Clinical cytogeneticist. Por lo general respondemos a los mensajes de MyChart en el transcurso de 1 a 2 das hbiles.  Para renovar recetas, por favor pida a su farmacia que se ponga en contacto con nuestra oficina. Randi lakes de fax es Chemult [phone].  Si tiene un asunto urgente cuando la clnica est cerrada y que no puede esperar hasta el siguiente da hbil, puede llamar/localizar a su doctor(a) al nmero que aparece a continuacin.   Por  favor, tenga en cuenta que aunque hacemos todo lo posible para estar disponibles para asuntos urgentes fuera del horario de [place], no estamos disponibles las 24 horas del da, los 7 [address]  [address] de la [place].   Si tiene un problema urgente y no puede comunicarse con nosotros, puede optar por buscar atencin mdica  en el consultorio de su doctor(a), en una clnica privada, en un centro de atencin urgente o en una sala de emergencias.  Si tiene Engineer, drilling, por favor llame inmediatamente al 911 o vaya a la sala de emergencias.  Nmeros de bper  - Dr. Hester: [phone]  - Dra. Jackquline: [phone]  - Dr. Claudene: [phone]  - Dra. Kitts: [phone]  En caso de inclemencias del [place], por favor llame a nuestra lnea principal al [phone] para una actualizacin sobre el estado de cualquier retraso o cierre.  Consejos para la medicacin en dermatologa: Por favor, guarde las cajas en las que vienen los medicamentos de uso tpico para ayudarle a seguir las instrucciones sobre dnde y cmo usarlos. Las farmacias generalmente imprimen las instrucciones del  medicamento slo en las cajas y no directamente en los tubos del [place].   Si su medicamento es muy caro, por favor, pngase en contacto con landry rieger llamando al [phone] y presione la opcin 4 o envenos un mensaje a travs de Clinical cytogeneticist.   No podemos decirle cul ser su copago por los medicamentos por adelantado ya que esto es diferente dependiendo de la cobertura de su seguro. Sin embargo, es posible que podamos encontrar un medicamento sustituto a Audiological scientist un formulario para que el seguro cubra el medicamento que se considera necesario.   Si se requiere una autorizacin previa para que su compaa de seguros malta su medicamento, por favor permtanos de 1 a 2 das hbiles para completar este proceso.  Los precios de los medicamentos varan con frecuencia dependiendo del Environmental consultant de dnde se surte la receta y alguna farmacias pueden ofrecer precios ms baratos.  El sitio web www.goodrx.com tiene cupones para medicamentos de Health and safety inspector. Los precios aqu no tienen en cuenta lo que podra costar con la ayuda del seguro (puede ser ms barato con su seguro), pero el sitio web puede darle el precio si no utiliz Tourist information centre manager.  - Puede imprimir el cupn correspondiente y llevarlo con su receta a la farmacia.  - Tambin puede pasar por nuestra oficina durante el horario de atencin regular y Education officer, museum una tarjeta de cupones de GoodRx.  - Si necesita que su receta se enve electrnicamente a una farmacia diferente, informe a nuestra oficina a travs de MyChart de [place] o por telfono llamando al [phone] y presione la opcin 4.

## 2024-01-02 NOTE — Progress Notes (Signed)
 New Patient Visit   Subjective  Candace Vaughn is a 43 y.o. female who presents for the following: Skin Cancer Screening and Full Body Skin Exam, no hx of skin cancer, family hx of skin cancer with uncle and cousin, not sure type  The patient presents for Total-Body Skin Exam (TBSE) for skin cancer screening and mole check. The patient has spots, moles and lesions to be evaluated, some may be new or changing and the patient may have concern these could be cancer.    The following portions of the chart were reviewed this encounter and updated as appropriate: medications, allergies, medical history  Review of Systems:  No other skin or systemic complaints except as noted in HPI or Assessment and Plan.  Objective  Well appearing patient in no apparent distress; mood and affect are within normal limits.  A full examination was performed including scalp, head, eyes, ears, nose, lips, neck, chest, axillae, abdomen, back, buttocks, bilateral upper extremities, bilateral lower extremities, hands, feet, fingers, toes, fingernails, and toenails. All findings within normal limits unless otherwise noted below.   Relevant physical exam findings are noted in the Assessment and Plan.  L post shoulder 7.59mm brown pap  Right Upper Back 9.3mm brown pap   L post neck   Assessment & Plan   SKIN CANCER SCREENING PERFORMED TODAY.  ACTINIC DAMAGE - Chronic condition, secondary to cumulative UV/sun exposure - diffuse scaly erythematous macules with underlying dyspigmentation - Recommend daily broad spectrum sunscreen SPF 30+ to sun-exposed areas, reapply every 2 hours as needed.  - Staying in the shade or wearing long sleeves, sun glasses (UVA+UVB protection) and wide brim hats (4-inch brim around the entire circumference of the hat) are also recommended for sun protection.  - Call for new or changing lesions.  LENTIGINES, SEBORRHEIC KERATOSES, HEMANGIOMAS - Benign normal skin lesions -  Benign-appearing - Call for any changes  MELANOCYTIC NEVI - Tan-brown and/or pink-flesh-colored symmetric macules and papules - 4 mm thin brown papule slightly waxy L post neck (vs SK), photo today - Benign appearing on exam today - Observation - Call clinic for new or changing moles - Recommend daily use of broad spectrum spf 30+ sunscreen to sun-exposed areas.   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 Condition is improving with treatment but not currently at goal.   Rosacea is a chronic progressive skin condition usually affecting the face of adults, causing redness and/or acne bumps. It is treatable but not curable. It sometimes affects the eyes (ocular rosacea) as well. It may respond to topical and/or systemic medication and can flare with stress, sun exposure, alcohol, exercise, topical steroids (including hydrocortisone/cortisone 10) and some foods.  Daily application of broad spectrum spf 30+ sunscreen to face is recommended to reduce flares.  Treatment Plan Cont [place]  Rosacea cr compound (oxymetazoline, metronidazole, ivermectin, and azelaic acid) qam as prescribed by Dr. Alm Kays - diffuse xerotic patches lower legs - recommend gentle, hydrating skin care - gentle skin care handout given, daily moisturizing cream after shower to lower legs  ACANTHOSIS NIGRICANS Post neck Exam: velvety hyperpigmentation post neck  Chronic and persistent condition with duration or expected duration over one year.   Acanthosis nigricans is a chronic localized skin disorder manifesting with hyperpigmented, velvety plaques typically located in flexural/body  fold regions. It is most commonly associated with obesity and is linked to type 2 diabetes, insulin resistance, and metabolic syndrome.   Rarely it can be associated with other systemic causes.   Treatment Plan: Benign, observe.  Discussed it is a sign of pre-diabetes.  She is not diabetic but both her parents are diabetic.  Recommend decrease sugar intake, increase vegetables/protein, and weight loss.  NEOPLASM OF SKIN (2) L post shoulder Epidermal / dermal shaving  Lesion diameter (cm):  0.7 Informed consent: discussed and consent obtained   Patient was prepped and draped in usual sterile fashion: area prepped with alcohol. Anesthesia: the lesion was anesthetized in a standard fashion   Anesthetic:  1% lidocaine w/ epinephrine 1-100,000 buffered w/ 8.4% NaHCO3 Instrument used: flexible razor blade   Hemostasis achieved with: pressure, aluminum chloride and electrodesiccation   Outcome: patient tolerated procedure well   Post-procedure details: wound care instructions given   Post-procedure details comment:  Ointment and small bandage applied  Specimen 1 - Surgical pathology Differential Diagnosis: Irritated Nevus r/o Atypia  Check Margins: yes 7.79mm brown pap Right Upper Back Epidermal / dermal shaving  Lesion diameter (cm):  0.9 Informed consent: discussed and consent obtained   Patient was prepped and draped in usual sterile fashion: area prepped with alcohol. Anesthesia: the lesion was anesthetized in a standard fashion   Anesthetic:  1% lidocaine w/ epinephrine 1-100,000 buffered w/ 8.4% NaHCO3 Instrument used: flexible razor blade   Hemostasis achieved with: pressure, aluminum chloride and electrodesiccation   Outcome: patient tolerated procedure well   Post-procedure details: wound care instructions given   Post-procedure details comment:  Ointment and small bandage applied  Specimen 2 - Surgical pathology Differential Diagnosis: Irritated Nevus r/o Atypia  Check Margins: yes 9.84mm brown pap  Return in about 1 year (around 01/01/2025) for TBSE.  I, Grayce Saunas, RMA, am acting as scribe for Rexene Rattler, MD .   Documentation: I have reviewed the above documentation for accuracy and completeness, and I agree with the above.  Rexene Rattler, MD

## 2024-01-05 LAB — SURGICAL PATHOLOGY

## 2024-01-09 ENCOUNTER — Ambulatory Visit: Payer: Self-pay | Admitting: Dermatology

## 2024-01-09 NOTE — Telephone Encounter (Signed)
-----   Message from Rexene Rattler sent at 01/09/2024  9:05 AM EDT ----- 1. Skin, left post shoulder :       MELANOCYTIC NEVUS, INTRADERMAL TYPE, BASE INVOLVED  2. Skin, right upper back :       MELANOCYTIC NEVUS, INTRADERMAL TYPE, BASE INVOLVED    1 and 2. Both benign moles, may recur- please call patient ----- Message ----- From: Interface, Lab [facility] One Sent: 01/05/2024   3:59 PM EDT To: Rexene Rattler, MD

## 2024-01-09 NOTE — Telephone Encounter (Signed)
 Advised patient biopsies were both benign, no further treatment needed.

## 2024-01-11 NOTE — Addendum Note (Signed)
 Addended by: JACKQULINE SAWYER on: 01/11/2024 04:00 PM   Modules accepted: Level of Service

## 2024-01-25 DIAGNOSIS — Z419 Encounter for procedure for purposes other than remedying health state, unspecified: Secondary | ICD-10-CM | POA: Diagnosis not present

## 2024-01-31 ENCOUNTER — Other Ambulatory Visit: Payer: Self-pay | Admitting: Nurse Practitioner

## 2024-01-31 DIAGNOSIS — F419 Anxiety disorder, unspecified: Secondary | ICD-10-CM

## 2024-01-31 DIAGNOSIS — F332 Major depressive disorder, recurrent severe without psychotic features: Secondary | ICD-10-CM

## 2024-02-02 DIAGNOSIS — M546 Pain in thoracic spine: Secondary | ICD-10-CM | POA: Diagnosis not present

## 2024-02-02 DIAGNOSIS — M5416 Radiculopathy, lumbar region: Secondary | ICD-10-CM | POA: Diagnosis not present

## 2024-02-02 DIAGNOSIS — M5412 Radiculopathy, cervical region: Secondary | ICD-10-CM | POA: Diagnosis not present

## 2024-02-02 DIAGNOSIS — M62830 Muscle spasm of back: Secondary | ICD-10-CM | POA: Diagnosis not present

## 2024-02-02 DIAGNOSIS — M9902 Segmental and somatic dysfunction of thoracic region: Secondary | ICD-10-CM | POA: Diagnosis not present

## 2024-02-02 DIAGNOSIS — M5418 Radiculopathy, sacral and sacrococcygeal region: Secondary | ICD-10-CM | POA: Diagnosis not present

## 2024-02-02 DIAGNOSIS — M9904 Segmental and somatic dysfunction of sacral region: Secondary | ICD-10-CM | POA: Diagnosis not present

## 2024-02-02 DIAGNOSIS — M9903 Segmental and somatic dysfunction of lumbar region: Secondary | ICD-10-CM | POA: Diagnosis not present

## 2024-02-02 DIAGNOSIS — M9901 Segmental and somatic dysfunction of cervical region: Secondary | ICD-10-CM | POA: Diagnosis not present

## 2024-02-02 NOTE — Telephone Encounter (Signed)
 Requested Prescriptions  Pending Prescriptions Disp Refills   buPROPion  (WELLBUTRIN  XL) 150 MG 24 hr tablet [Pharmacy Med Name: buPROPion  HCl ER (XL) 150 MG Oral Tablet Extended Release 24 Hour] 90 tablet 0    Sig: Take 1 tablet by mouth once daily     Psychiatry: Antidepressants - bupropion  Passed - 02/02/2024  3:54 PM      Passed - Cr in normal range and within 360 days    Creat  Date Value Ref Range Status  11/08/2023 0.70 0.50 - 0.99 mg/dL Final         Passed - AST in normal range and within 360 days    AST  Date Value Ref Range Status  11/08/2023 12 10 - 30 U/L Final         Passed - ALT in normal range and within 360 days    ALT  Date Value Ref Range Status  11/08/2023 18 6 - 29 U/L Final         Passed - Completed PHQ-2 or PHQ-9 in the last 360 days      Passed - Last BP in normal range    BP Readings from Last 1 Encounters:  12/15/23 93/64         Passed - Valid encounter within last 6 months    Recent Outpatient Visits           2 months ago Annual physical exam   [facility] Gareth Mliss FALCON, FNP   6 months ago Anxiety   [facility] Gareth Mliss F, FNP   7 months ago Severe episode of recurrent major depressive disorder, without psychotic features [facility])   [facility] Gareth Mliss FALCON, FNP       Future Appointments             In 9 months Gareth, Mliss FALCON, FNP [facility] [place]   In 11 months Jackquline Sawyer, MD [facility]

## 2024-02-08 DIAGNOSIS — F419 Anxiety disorder, unspecified: Secondary | ICD-10-CM | POA: Diagnosis not present

## 2024-02-08 DIAGNOSIS — E0590 Thyrotoxicosis, unspecified without thyrotoxic crisis or storm: Secondary | ICD-10-CM | POA: Diagnosis not present

## 2024-03-22 ENCOUNTER — Encounter: Payer: Self-pay | Admitting: Nurse Practitioner

## 2024-03-22 DIAGNOSIS — M419 Scoliosis, unspecified: Secondary | ICD-10-CM

## 2024-04-04 ENCOUNTER — Other Ambulatory Visit: Payer: Self-pay | Admitting: Nurse Practitioner

## 2024-04-04 DIAGNOSIS — G43009 Migraine without aura, not intractable, without status migrainosus: Secondary | ICD-10-CM

## 2024-04-05 NOTE — Telephone Encounter (Signed)
 Requested Prescriptions  Pending Prescriptions Disp Refills   propranolol  (INDERAL ) 80 MG tablet [Pharmacy Med Name: Propranolol  HCl 80 MG Oral Tablet] 90 tablet 0    Sig: Take 1 tablet by mouth once daily     Cardiovascular:  Beta Blockers Passed - 04/05/2024  9:56 AM      Passed - Last BP in normal range    BP Readings from Last 1 Encounters:  12/15/23 93/64         Passed - Last Heart Rate in normal range    Pulse Readings from Last 1 Encounters:  12/15/23 65         Passed - Valid encounter within last 6 months    Recent Outpatient Visits           4 months ago Annual physical exam   [facility] Gareth Mliss FALCON, FNP   8 months ago Anxiety   [facility] Gareth Mliss F, FNP   9 months ago Severe episode of recurrent major depressive disorder, without psychotic features [facility])   [facility] Gareth Mliss FALCON, FNP       Future Appointments             In 7 months Gareth, Mliss FALCON, FNP [facility] [place]   In 9 months Jackquline Sawyer, MD [facility]

## 2024-07-24 ENCOUNTER — Ambulatory Visit: Admitting: Dermatology

## 2024-11-08 ENCOUNTER — Encounter: Admitting: Nurse Practitioner

## 2024-12-31 ENCOUNTER — Ambulatory Visit: Admitting: Dermatology
